# Patient Record
Sex: Female | Born: 1935 | Race: White | Hispanic: No | Marital: Married | Smoking: Never smoker
Health system: Southern US, Community
[De-identification: ages and names within clinical notes are randomized; demographics above are authoritative.]

## PROBLEM LIST (undated history)

## (undated) DIAGNOSIS — I679 Cerebrovascular disease, unspecified: Principal | ICD-10-CM

## (undated) DIAGNOSIS — C801 Malignant (primary) neoplasm, unspecified: Secondary | ICD-10-CM

## (undated) DIAGNOSIS — M810 Age-related osteoporosis without current pathological fracture: Secondary | ICD-10-CM

## (undated) DIAGNOSIS — H269 Unspecified cataract: Secondary | ICD-10-CM

## (undated) DIAGNOSIS — R269 Unspecified abnormalities of gait and mobility: Secondary | ICD-10-CM

## (undated) HISTORY — PX: PARTIAL HYSTERECTOMY: SHX80

## (undated) HISTORY — PX: APPENDECTOMY: SHX54

## (undated) HISTORY — DX: Cerebrovascular disease, unspecified: I67.9

## (undated) HISTORY — DX: Age-related osteoporosis without current pathological fracture: M81.0

## (undated) HISTORY — PX: BREAST SURGERY: SHX581

## (undated) HISTORY — PX: EYE SURGERY: SHX253

## (undated) HISTORY — DX: Malignant (primary) neoplasm, unspecified: C80.1

## (undated) HISTORY — DX: Unspecified cataract: H26.9

## (undated) HISTORY — DX: Unspecified abnormalities of gait and mobility: R26.9

---

## 1999-05-14 ENCOUNTER — Encounter: Admission: RE | Admit: 1999-05-14 | Discharge: 1999-05-14 | Payer: Self-pay | Admitting: Urology

## 1999-05-14 ENCOUNTER — Encounter: Payer: Self-pay | Admitting: Urology

## 1999-06-05 ENCOUNTER — Encounter: Admission: RE | Admit: 1999-06-05 | Discharge: 1999-06-05 | Payer: Self-pay | Admitting: *Deleted

## 1999-07-15 ENCOUNTER — Other Ambulatory Visit: Admission: RE | Admit: 1999-07-15 | Discharge: 1999-07-15 | Payer: Self-pay | Admitting: *Deleted

## 2000-05-11 ENCOUNTER — Encounter: Admission: RE | Admit: 2000-05-11 | Discharge: 2000-05-11 | Payer: Self-pay | Admitting: Urology

## 2000-05-11 ENCOUNTER — Encounter: Payer: Self-pay | Admitting: Urology

## 2001-06-28 ENCOUNTER — Encounter: Admission: RE | Admit: 2001-06-28 | Discharge: 2001-06-28 | Payer: Self-pay | Admitting: Urology

## 2001-06-28 ENCOUNTER — Encounter: Payer: Self-pay | Admitting: Urology

## 2002-07-11 ENCOUNTER — Encounter: Admission: RE | Admit: 2002-07-11 | Discharge: 2002-07-11 | Payer: Self-pay | Admitting: Urology

## 2002-07-11 ENCOUNTER — Encounter: Payer: Self-pay | Admitting: Urology

## 2002-08-04 ENCOUNTER — Encounter: Admission: RE | Admit: 2002-08-04 | Discharge: 2002-08-04 | Payer: Self-pay | Admitting: *Deleted

## 2002-08-04 ENCOUNTER — Other Ambulatory Visit: Admission: RE | Admit: 2002-08-04 | Discharge: 2002-08-04 | Payer: Self-pay | Admitting: *Deleted

## 2003-07-11 ENCOUNTER — Ambulatory Visit (HOSPITAL_COMMUNITY): Admission: RE | Admit: 2003-07-11 | Discharge: 2003-07-11 | Payer: Self-pay | Admitting: Urology

## 2003-08-16 ENCOUNTER — Encounter: Admission: RE | Admit: 2003-08-16 | Discharge: 2003-08-16 | Payer: Self-pay | Admitting: *Deleted

## 2004-07-16 ENCOUNTER — Ambulatory Visit (HOSPITAL_COMMUNITY): Admission: RE | Admit: 2004-07-16 | Discharge: 2004-07-16 | Payer: Self-pay | Admitting: Urology

## 2005-07-17 ENCOUNTER — Ambulatory Visit (HOSPITAL_COMMUNITY): Admission: RE | Admit: 2005-07-17 | Discharge: 2005-07-17 | Payer: Self-pay | Admitting: Urology

## 2006-07-21 ENCOUNTER — Ambulatory Visit (HOSPITAL_COMMUNITY): Admission: RE | Admit: 2006-07-21 | Discharge: 2006-07-21 | Payer: Self-pay | Admitting: Urology

## 2007-07-27 ENCOUNTER — Ambulatory Visit (HOSPITAL_COMMUNITY): Admission: RE | Admit: 2007-07-27 | Discharge: 2007-07-27 | Payer: Self-pay | Admitting: Urology

## 2014-02-28 ENCOUNTER — Ambulatory Visit (INDEPENDENT_AMBULATORY_CARE_PROVIDER_SITE_OTHER): Payer: Medicare Other

## 2014-02-28 ENCOUNTER — Ambulatory Visit (INDEPENDENT_AMBULATORY_CARE_PROVIDER_SITE_OTHER): Payer: Medicare Other | Admitting: Family Medicine

## 2014-02-28 VITALS — BP 134/92 | HR 102 | Temp 98.0°F | Resp 16 | Ht 59.25 in | Wt 110.4 lb

## 2014-02-28 DIAGNOSIS — M79604 Pain in right leg: Secondary | ICD-10-CM

## 2014-02-28 DIAGNOSIS — I872 Venous insufficiency (chronic) (peripheral): Secondary | ICD-10-CM

## 2014-02-28 DIAGNOSIS — R600 Localized edema: Secondary | ICD-10-CM

## 2014-02-28 DIAGNOSIS — M25571 Pain in right ankle and joints of right foot: Secondary | ICD-10-CM

## 2014-02-28 NOTE — Patient Instructions (Addendum)
When at rest keep the leg elevated, avoiding prolonged standing  It is okay to walk, it is the non-motion standing that is of more concern.  Continue wearing the compression stocking  Referral will be made to the vein clinic on a [address] Dr. Cresenciano Lick. When that appointment is made check with their office to make sure that your insurance he applies.  Return if worse at any time.

## 2014-02-28 NOTE — Progress Notes (Signed)
Subjective: About a month ago the patient developed swelling for unexplained reasons in the right leg. She has been an active walker, walking up to 2 miles a day. She knows of no injury or anything she does differently. She ago for about a week, then saw her primary care doctor who checked a venous Doppler ultrasound. This did not reveal any clot. The report was located and I reviewed it. The patient has had tenderness in the posterior medial aspect of the lower leg and ankle. It does not hurt when she does not palpated. The swelling continues to persist. The primary care doctor advised wearing support hose on that leg, which she has done, with limited improvement.  Objective: Minimal cystic nature in the right popliteal fossa. Leg is swollen, 2+ to 3+. She has tenderness over the posterior medial aspect of the gastrocnemius. The swelling extends into the ankle. She is tender down her shin.  [facility] reading (PRIMARY) by  Dr. Linna Darner Normal tib/fib Normal ankle  Assessment: Venous valvular insufficiency right lower extremity Small Baker's cyst on ultrasound  Plan: Elevate at rest Walking is okay Wear support hose Referral to the vein clinic for evaluation.

## 2014-03-02 ENCOUNTER — Telehealth: Payer: Self-pay

## 2014-03-02 NOTE — Telephone Encounter (Signed)
Patient called to ask the status of her ortho referral. I told her as soon as it is in our workque we will work on it. Waiting for Dr. Linna Darner to put on in or someone clinical. Thank you

## 2014-03-02 NOTE — Telephone Encounter (Signed)
I see referral to vein clinic. Is this correct? Or do we need Ortho eval also? Please advise.

## 2014-03-04 ENCOUNTER — Other Ambulatory Visit: Payer: Self-pay | Admitting: Physician Assistant

## 2014-03-04 DIAGNOSIS — M79604 Pain in right leg: Secondary | ICD-10-CM

## 2014-03-04 DIAGNOSIS — M25571 Pain in right ankle and joints of right foot: Secondary | ICD-10-CM

## 2014-03-04 NOTE — Telephone Encounter (Signed)
I did ortho eval due to ankle pain.

## 2017-10-27 ENCOUNTER — Other Ambulatory Visit: Payer: Self-pay

## 2017-10-27 ENCOUNTER — Telehealth: Payer: Self-pay | Admitting: Neurology

## 2017-10-27 ENCOUNTER — Encounter: Payer: Self-pay | Admitting: Neurology

## 2017-10-27 ENCOUNTER — Ambulatory Visit: Payer: Medicare Other | Admitting: Neurology

## 2017-10-27 DIAGNOSIS — R413 Other amnesia: Secondary | ICD-10-CM

## 2017-10-27 MED ORDER — DONEPEZIL HCL 5 MG PO TABS: 5.0000 mg | ORAL_TABLET | Freq: Every day | ORAL | 1 refills | Status: DC

## 2017-10-27 NOTE — Telephone Encounter (Signed)
[employer] medicare order sent to GI. No auth they will reach out to the pt to schedule.  °

## 2017-10-27 NOTE — Patient Instructions (Signed)
We will start Aricept for memory.  Begin Aricept (donepezil) at 5 mg at night for one month. If this medication is well-tolerated, please call our office and we will call in a prescription for the 10 mg tablets. Look out for side effects that may include nausea, diarrhea, weight loss, or stomach cramps. This medication will also cause a runny nose, therefore there is no need for allergy medications for this purpose.  

## 2017-10-27 NOTE — Progress Notes (Signed)
Reason for visit: Memory disturbance  Referring physician: Dr. Shawnee Knapp is a 84 y.o. female  History of present illness:  Jessica Benton is an 84 year old white female with a history of a progressive memory disturbance.  The patient comes in with her husband and son and daughter.  The patient apparently has had some progression of memory over 2 or 3 years.  One year ago she was still operating a motor vehicle but she was getting lost, she could not remember how to get back from the dentist office to her own home.  At that point, she stopped driving.  The patient has required assistance with keeping up with medications and appointments 1 year ago and 8 months ago began requiring some assistance with doing the finances.  The patient does not cook, her husband does this.  The patient is maintaining her weight fairly well over the last year or 2.  The patient has not had any falls, her balance has changed over the last several years.  She is not sleeping well, she may become agitated at times.  She denies issues controlling the bowels or the bladder.  She denies any numbness or weakness of the extremities.  The patient lives in a home with her husband, they do have a flight of stairs that they need to go up and down every day.  The family is helping out with getting the groceries and with transportation.  The patient has had blood work done to include a thyroid profile and B12 level that were unremarkable.  She is sent to this office for further evaluation.  Past Medical History:  Diagnosis Date  . Cancer ([place])   . Cataract   . Osteoporosis     Past Surgical History:  Procedure Laterality Date  . APPENDECTOMY    . BREAST SURGERY    . EYE SURGERY    . PARTIAL HYSTERECTOMY      Family History  Problem Relation Age of Onset  . Parkinsonism Mother   . Dementia Mother   . Heart attack Father     Social history:  reports that she has never smoked. She has never used smokeless  tobacco. She reports that she does not drink alcohol or use drugs.  Medications:  Prior to Admission medications   Not on File      Allergies  Allergen Reactions  . Morphine And Related   . Sulfa Antibiotics     ROS:  Out of a complete 14 system review of symptoms, the patient complains only of the following symptoms, and all other reviewed systems are negative.  Constipation Joint pain, joint swelling Memory loss, confusion Insomnia, sleepiness   Blood pressure 135/82, pulse 77, weight 96 lb (43.5 kg).  Physical Exam  General: The patient is alert and cooperative at the time of the examination.  Eyes: Pupils are equal, round, and reactive to light. Discs are flat bilaterally.  Neck: The neck is supple, no carotid bruits are noted.  Respiratory: The respiratory examination is clear.  Cardiovascular: The cardiovascular examination reveals a regular rate and rhythm, no obvious murmurs or rubs are noted.  Skin: Extremities are without significant edema.  Neurologic Exam  Mental status: The patient is alert and oriented x 2 at the time of the examination (not oriented to date). The Mini-Mental status examination done today shows a total score of 20/30.  Cranial nerves: Facial symmetry is present. There is good sensation of the face to pinprick and  soft touch bilaterally. The strength of the facial muscles and the muscles to head turning and shoulder shrug are normal bilaterally. Speech is well enunciated, no aphasia or dysarthria is noted. Extraocular movements are full. Visual fields are full. The tongue is midline, and the patient has symmetric elevation of the soft palate. No obvious hearing deficits are noted.  Motor: The motor testing reveals 5 over 5 strength of all 4 extremities. Good symmetric motor tone is noted throughout.  Sensory: Sensory testing is intact to pinprick, soft touch and vibration sensation on all 4 extremities.  The patient has poor position sense  on all 4 extremities, it is not clear whether she is perseverating or she is unable to sense position of her fingers and toes.  No evidence of extinction is noted.  Coordination: Cerebellar testing reveals good finger-nose-finger and heel-to-shin bilaterally.  Gait and station: Gait is slightly wide-based, the patient walk independently.  Romberg is negative.  Reflexes: Deep tendon reflexes are symmetric and normal bilaterally. Toes are downgoing bilaterally.   Assessment/Plan:  1.  Progressive memory disturbance  The patient will be placed on low-dose Aricept, they will call for any dose adjustments.  He will be set up for a CT scan of the brain.  She will follow-up in 6 months.  Jill Alexanders MD 10/27/2017 2:48 PM  [facility] [address] [place], [place] [address]  Phone [phone] Fax [phone]

## 2017-11-11 ENCOUNTER — Ambulatory Visit
Admission: RE | Admit: 2017-11-11 | Discharge: 2017-11-11 | Disposition: A | Discharge status: Home / Self Care | Payer: Medicare Other | Source: Ambulatory Visit | Attending: Neurology | Admitting: Neurology

## 2017-11-11 DIAGNOSIS — R413 Other amnesia: Secondary | ICD-10-CM | POA: Diagnosis not present

## 2017-11-12 ENCOUNTER — Telehealth: Payer: Self-pay | Admitting: Neurology

## 2017-11-12 NOTE — Telephone Encounter (Signed)
I called the patient, talk with husband.  CT of the head shows extensive white matter disease, I would recommend going on 81 mg aspirin daily.  The patient will continue on the donepezil.    CT head 11/12/17:  IMPRESSION: This CT scan of the head without contrast shows the following: 1.    Moderately severe generalized cortical atrophy.  The atrophy is not more pronounced in the mesial temporal lobes than elsewhere.  There is also cerebellar and corpus callosal atrophy noted. 2.    Severe white matter changes in both hemispheres consistent with severe chronic microvascular ischemic change.   3.   There are no acute findings.

## 2017-11-16 NOTE — Telephone Encounter (Signed)
Pt's daughter Holli/DPR [phone] asked for call back from Dr Jannifer Franklin. She is wanting clarification on MRI result, she is not sure her father relayed the results correctly.

## 2017-11-16 NOTE — Telephone Encounter (Signed)
I called the daughter.  CT scan of the brain did show extensive white matter changes, for this reason, low-dose aspirin was recommended.  She will call back if any further questions are noted.

## 2017-11-30 ENCOUNTER — Other Ambulatory Visit: Payer: Self-pay | Admitting: Neurology

## 2017-11-30 NOTE — Telephone Encounter (Signed)
I increased donepezil to 10mg  qhs.   I agree with [facility] eval. Patient should use walker.   Penni Bombard, MD 08/31/3071, 5:43 PM Certified in Neurology, Neurophysiology and Neuroimaging  [facility] [address] [place],  [address] [phone]

## 2017-11-30 NOTE — Telephone Encounter (Signed)
Called and spoke with daughter, [facility]. Relayed Dr. Gladstone Lighter message.  She is concerned about her mothers memory changes. I offered appt tomorrow at 1130am with Jinny Blossom, NP but she declined. She will continue to monitor her and call back if they would like her seen. Advised they can also f/u with PCP if needed. We can also schedule f/u for her to see Dr. Jannifer Franklin. She verbalized understanding and appreciation.

## 2017-11-30 NOTE — Telephone Encounter (Signed)
Called daughter, [facility]. She states her mother is doing well on Aricept 5mg  tablet, no SE. She is agreeable to increase to 10mg  tablet at bedtime if ok per [facility]. She is wondering if she is able to take at dinnertime instead as well.   She also wanted to inform Dr. Jannifer Franklin that she is now holding onto others while trying to ambulate. She is more unsteady on feet, having balance problems. Does not ambulate with any assistive devices. Has walker to use if needed at their house. She does not like to use this. Only recent medication started was the ASA 81mg  qd by Dr. Jannifer Franklin.  They are having initial evaluation for in home care by [facility] this coming Friday. They are going to evaluate both her and husband. Husband requested more help in the home. They are going to help with ADL's., help around the house, and medication administration. She has had no falls since last seen. She gets easily fatigued. She has hx Osteoporosis.  This past Saturday they cleaned out her closet and she got very tired. Sleeps a lot during the day. Roaming at night sometimes per husband.   Daughter wanting to know if anything else should be done given the above information. Advised Dr. Jannifer Franklin out of the office. I will send to [facility] to advise. We will call her back.

## 2017-12-29 ENCOUNTER — Other Ambulatory Visit: Payer: Self-pay | Admitting: Neurology

## 2018-05-11 ENCOUNTER — Encounter: Payer: Self-pay | Admitting: Adult Health

## 2018-05-11 ENCOUNTER — Ambulatory Visit: Payer: Medicare Other | Admitting: Adult Health

## 2018-06-13 ENCOUNTER — Ambulatory Visit: Payer: Medicare Other | Admitting: Neurology

## 2018-06-13 ENCOUNTER — Encounter: Payer: Self-pay | Admitting: Neurology

## 2018-06-13 DIAGNOSIS — F0150 Vascular dementia without behavioral disturbance: Secondary | ICD-10-CM | POA: Insufficient documentation

## 2018-06-13 DIAGNOSIS — I679 Cerebrovascular disease, unspecified: Secondary | ICD-10-CM

## 2018-06-13 DIAGNOSIS — R269 Unspecified abnormalities of gait and mobility: Secondary | ICD-10-CM | POA: Diagnosis not present

## 2018-06-13 DIAGNOSIS — G309 Alzheimer's disease, unspecified: Secondary | ICD-10-CM

## 2018-06-13 DIAGNOSIS — F0280 Dementia in other diseases classified elsewhere without behavioral disturbance: Secondary | ICD-10-CM

## 2018-06-13 HISTORY — DX: Cerebrovascular disease, unspecified: I67.9

## 2018-06-13 HISTORY — DX: Unspecified abnormalities of gait and mobility: R26.9

## 2018-06-13 MED ORDER — DONEPEZIL HCL 10 MG PO TABS: 10.0000 mg | ORAL_TABLET | Freq: Every day | ORAL | 3 refills | Status: DC

## 2018-06-13 NOTE — Progress Notes (Signed)
Reason for visit: Memory disturbance  Jessica Benton is an 85 y.o. female  History of present illness:  Jessica Benton is an 85 year old right-handed white female with a history of a progressive memory disturbance.  The patient has been placed on Aricept taking 10 mg at night, she has tolerated the medication fairly well, she has lost only 2 pounds since last seen.  The daughter who accompanies her today indicates that there has been some change in memory since the summer with the short-term memory.  The patient occasionally will have periods of agitation.  The patient does have a mild gait disturbance, she does not use a cane, she has not had any falls since last seen.  She has to walk up and down a flight of stairs to her bedroom each day.  The patient did have a CT scan of the brain that showed extensive white matter changes, she was asked to go on low-dose aspirin but she has not consistently taken this.  She returns for an evaluation.  Past Medical History:  Diagnosis Date  . Cancer ([place])   . Cataract   . Osteoporosis     Past Surgical History:  Procedure Laterality Date  . APPENDECTOMY    . BREAST SURGERY    . EYE SURGERY    . PARTIAL HYSTERECTOMY      Family History  Problem Relation Age of Onset  . Parkinsonism Mother   . Dementia Mother   . Heart attack Father     Social history:  reports that she has never smoked. She has never used smokeless tobacco. She reports that she does not drink alcohol or use drugs.    Allergies  Allergen Reactions  . Morphine And Related   . Sulfa Antibiotics     Medications:  Prior to Admission medications   Medication Sig Start Date End Date Taking? Authorizing Provider  donepezil (ARICEPT) 10 MG tablet Take 1 tablet (10 mg total) by mouth at bedtime. 11/30/17   Penumalli, Earlean Polka, MD    ROS:  Out of a complete 14 system review of symptoms, the patient complains only of the following symptoms, and all other reviewed systems are  negative.  Runny nose Memory loss  Blood pressure (!) 146/81, pulse 67, height 4' 10.5" (1.486 m), weight 94 lb (42.6 kg).  Physical Exam  General: The patient is alert and cooperative at the time of the examination.  Skin: No significant peripheral edema is noted.   Neurologic Exam  Mental status: The patient is alert and oriented x 2 at the time of the examination (not oriented to date). The Mini-Mental status examination done today shows a total score 18/30.   Cranial nerves: Facial symmetry is present. Speech is normal, no aphasia or dysarthria is noted. Extraocular movements are full. Visual fields are full.  Motor: The patient has good strength in all 4 extremities.  Sensory examination: Soft touch sensation is symmetric on the face, arms, and legs.  Coordination: The patient has good finger-nose-finger and heel-to-shin bilaterally.  Gait and station: The patient has a slightly wide-based gait, the patient can walk independently.  Tandem gait is unsteady. Romberg is negative. No drift is seen.  Reflexes: Deep tendon reflexes are symmetric.   CT head 11/12/17:  IMPRESSION: This CT scan of the head without contrast shows the following: 1. Moderately severe generalized cortical atrophy. The atrophy is not more pronounced in the mesial temporal lobes than elsewhere. There is also cerebellar and corpus callosal  atrophy noted. 2. Severe white matter changes in both hemispheres consistent with severe chronic microvascular ischemic change.  3. There are no acute findings.   * CT scan images were reviewed online. I agree with the written report.    Assessment/Plan:  1.  Mixed Alzheimer disease and vascular dementia  The patient will remain on Aricept.  I have asked her to start low-dose aspirin, 81 mg daily.  She will follow-up in 6 months.  A prescription was sent in for the Aricept.  Jill Alexanders MD 06/13/2018 12:27 PM  [facility] [address] [place], [place] [address]  Phone [phone] Fax [phone]

## 2018-10-24 ENCOUNTER — Telehealth: Payer: Self-pay | Admitting: Neurology

## 2018-10-24 ENCOUNTER — Encounter: Payer: Self-pay | Admitting: Neurology

## 2018-10-24 NOTE — Telephone Encounter (Signed)
I called the daughter, the patient has a history of dementia, I will asked that her name be removed permanently from the list of potential juris.

## 2018-10-24 NOTE — Telephone Encounter (Signed)
Pt received a summons for [employer] and wants to know if a letter can been written to excuse her from doing so .

## 2018-10-25 NOTE — Telephone Encounter (Signed)
I contacted the pt's daughter, Earnest  ( ok per dpr) and advised letter is ready. Earnest  states she will come by the clinic this afternoon and pick the letter up.   Letter placed up front at [place].

## 2018-12-12 ENCOUNTER — Ambulatory Visit: Payer: Medicare Other | Admitting: Neurology

## 2018-12-13 NOTE — Progress Notes (Signed)
PATIENT: Jessica Benton DOB: 08/20/1928  REASON FOR VISIT: follow up HISTORY FROM: patient  HISTORY OF PRESENT ILLNESS: Today 12/14/18  Jessica Benton is an 85 year old female with history of progressive memory disturbance.  Her last memory score was 18/30.  She remains on Aricept 10 mg in the evening, she is tolerating medication well.  Her daughter accompanies her at today's visit, she reports she is seeing a decline in her memory since last visit.  She has noticed repetitive questioning, having an increasing time remembering things.  She is able to perform her ADLs with minimal assistance.  She does not do any cooking, but this is not new.  Her daughter prepares meals and takes leftovers to her parents.  She indicates her appetite is adequate, since last visit she has lost 3 pounds.  Her husband manages her medications, but sometimes she refuses to take them.  She indicates she sleeps well at night, but she goes to bed late, and then will sleep in.  Her daughter reports at times she gets frustrated easily, and has some agitation.  She has not had any recent fall.  She remains on aspirin 81 mg daily, after CT scan of the brain showed extensive white matter changes.  She presents today for follow-up.  HISTORY 06/13/2018 Dr. Jannifer Franklin: Ms. Kareem is an 85 year old right-handed white female with a history of a progressive memory disturbance.  The patient has been placed on Aricept taking 10 mg at night, she has tolerated the medication fairly well, she has lost only 2 pounds since last seen.  The daughter who accompanies her today indicates that there has been some change in memory since the summer with the short-term memory.  The patient occasionally will have periods of agitation.  The patient does have a mild gait disturbance, she does not use a cane, she has not had any falls since last seen.  She has to walk up and down a flight of stairs to her bedroom each day.  The patient did have a CT scan of  the brain that showed extensive white matter changes, she was asked to go on low-dose aspirin but she has not consistently taken this.  She returns for an evaluation.  REVIEW OF SYSTEMS: Out of a complete 14 system review of symptoms, the patient complains only of the following symptoms, and all other reviewed systems are negative.  Memory loss  ALLERGIES: Allergies  Allergen Reactions  . Morphine And Related   . Sulfa Antibiotics     HOME MEDICATIONS: Outpatient Medications Prior to Visit  Medication Sig Dispense Refill  . aspirin EC 81 MG tablet Take 81 mg by mouth daily. 1 in the morning and 1 at night.    . donepezil (ARICEPT) 10 MG tablet Take 1 tablet (10 mg total) by mouth at bedtime. 90 tablet 3   No facility-administered medications prior to visit.     PAST MEDICAL HISTORY: Past Medical History:  Diagnosis Date  . Cancer ([place])   . Cataract   . Cerebrovascular disease 06/13/2018  . Gait disorder 06/13/2018  . Osteoporosis     PAST SURGICAL HISTORY: Past Surgical History:  Procedure Laterality Date  . APPENDECTOMY    . BREAST SURGERY    . EYE SURGERY    . PARTIAL HYSTERECTOMY      FAMILY HISTORY: Family History  Problem Relation Age of Onset  . Parkinsonism Mother   . Dementia Mother   . Heart attack Father  SOCIAL HISTORY: Social History   Socioeconomic History  . Marital status: Married    Spouse name: Not on file  . Number of children: 2  . Years of education: 12+  . Highest education level: Not on file  Occupational History  . Occupation: Retired [employer]  Social Needs  . Financial resource strain: Not on file  . Food insecurity    Worry: Not on file    Inability: Not on file  . Transportation needs    Medical: Not on file    Non-medical: Not on file  Tobacco Use  . Smoking status: Never Smoker  . Smokeless tobacco: Never Used  Substance and Sexual Activity  . Alcohol use: No  . Drug use: No  . Sexual activity: Not on file   Lifestyle  . Physical activity    Days per week: Not on file    Minutes per session: Not on file  . Stress: Not on file  Relationships  . Social Herbalist on phone: Not on file    Gets together: Not on file    Attends religious service: Not on file    Active member of club or organization: Not on file    Attends meetings of clubs or organizations: Not on file    Relationship status: Not on file  . Intimate partner violence    Fear of current or ex partner: Not on file    Emotionally abused: Not on file    Physically abused: Not on file    Forced sexual activity: Not on file  Other Topics Concern  . Not on file  Social History Narrative   Lives with husband   Caffeine use: only decaf   Right handed     PHYSICAL EXAM  Vitals:   12/14/18 1109  BP: (!) 141/78  Pulse: 67  Temp: 98.2 F (36.8 C)  Weight: 91 lb 6.4 oz (41.5 kg)  Height: 4' 10.5" (1.486 m)   Body mass index is 18.78 kg/m.  Generalized: Well developed, in no acute distress  MMSE - Mini Mental State Exam 12/14/2018 06/13/2018 10/27/2017  Orientation to time 1 0 1  Orientation to Place 3 3 4   Registration 3 3 3   Attention/ Calculation 0 4 4  Recall 0 0 0  Language- name 2 objects 2 2 2   Language- repeat 1 1 1   Language- follow 3 step command 3 3 3   Language- read & follow direction 1 1 1   Write a sentence 1 1 1   Copy design 1 0 0  Copy design-comments named 7 animals - -  Total score 16 18 20     Neurological examination  Mentation: Alert oriented to place, most of history is provided by her daughter. Follows all commands speech and language fluent Cranial nerve II-XII: Pupils were equal round reactive to light. Extraocular movements were full, visual field were full on confrontational test. Facial sensation and strength were normal.  Head turning and shoulder shrug  were normal and symmetric. Motor: The motor testing reveals 5 over 5 strength of all 4 extremities. Good symmetric motor tone is  noted throughout.  Sensory: Sensory testing is intact to soft touch on all 4 extremities. No evidence of extinction is noted.  Coordination: Cerebellar testing reveals good finger-nose-finger and heel-to-shin bilaterally.  Gait and station: Gait is normal, but slow  Reflexes: Deep tendon reflexes are symmetric and normal bilaterally.   DIAGNOSTIC DATA (LABS, IMAGING, TESTING) - I reviewed patient records, labs,  notes, testing and imaging myself where available.  No results found for: WBC, HGB, HCT, MCV, PLT No results found for: NA, K, CL, CO2, GLUCOSE, BUN, CREATININE, CALCIUM, PROT, ALBUMIN, AST, ALT, ALKPHOS, BILITOT, GFRNONAA, GFRAA No results found for: CHOL, HDL, LDLCALC, LDLDIRECT, TRIG, CHOLHDL No results found for: HGBA1C No results found for: VITAMINB12 No results found for: TSH  ASSESSMENT AND PLAN 85 y.o. year old female  has a past medical history of Cancer ([place]), Cataract, Cerebrovascular disease (06/13/2018), Gait disorder (06/13/2018), and Osteoporosis. here with:  1.  Memory disturbance  She has had a slight decline in her memory score at 16/30.  She will continue taking Aricept 10 mg at bedtime, however we will closely monitor her weight.  Since last visit she has lost 3 pounds.  In the future, we may have to decrease the dose, as she only weighs 91 pounds currently.  We will start Namenda today, staying at 5 mg daily.  At times, her husband has difficulty getting her to take her medications.  She has displayed some frustration with her daughter and husband at times.  In the future, we may need to consider the addition of an SSRI for mood stabilization.  I have encouraged her to routinely follow with her primary care provider.  She will continue taking daily 81 mg aspirin for CT scan of the brain that showed extensive white matter changes.  In the future, her daughter is considering moving the patient into her home for ongoing care.  She will follow-up in 6 months or sooner if  needed.  I advised that if her symptoms worsen or she develops any new symptoms she should let us know.  I spent 15 minutes with the patient. 50% of this time was spent discussing her plan of care.   Butler Denmark, AGNP-C, DNP 12/14/2018, 12:03 PM [facility] [address], [place], [place] [address] [phone]

## 2018-12-14 ENCOUNTER — Encounter: Payer: Self-pay | Admitting: Neurology

## 2018-12-14 ENCOUNTER — Other Ambulatory Visit: Payer: Self-pay

## 2018-12-14 ENCOUNTER — Ambulatory Visit (INDEPENDENT_AMBULATORY_CARE_PROVIDER_SITE_OTHER): Payer: Medicare Other | Admitting: Neurology

## 2018-12-14 VITALS — BP 141/78 | HR 67 | Temp 98.2°F | Ht 58.5 in | Wt 91.4 lb

## 2018-12-14 DIAGNOSIS — F0150 Vascular dementia without behavioral disturbance: Secondary | ICD-10-CM | POA: Diagnosis not present

## 2018-12-14 DIAGNOSIS — F0280 Dementia in other diseases classified elsewhere without behavioral disturbance: Secondary | ICD-10-CM

## 2018-12-14 DIAGNOSIS — G309 Alzheimer's disease, unspecified: Secondary | ICD-10-CM

## 2018-12-14 MED ORDER — MEMANTINE HCL 5 MG PO TABS: 5.0000 mg | ORAL_TABLET | Freq: Every day | ORAL | 3 refills | Status: DC

## 2018-12-14 NOTE — Progress Notes (Signed)
I have read the note, and I agree with the clinical assessment and plan.  Jessica Benton [name]   

## 2018-12-14 NOTE — Progress Notes (Signed)
I have read the note, and I agree with the clinical assessment and plan.  Glendola Friedhoff K Shelden Raborn   

## 2018-12-14 NOTE — Patient Instructions (Signed)
Memantine Tablets What is this medicine? MEMANTINE (MEM an teen) is used to treat dementia caused by Alzheimer's disease. This medicine may be used for other purposes; ask your health care provider or pharmacist if you have questions. COMMON BRAND NAME(S): Namenda What should I tell my health care provider before I take this medicine? They need to know if you have any of these conditions:  difficulty passing urine  kidney disease  liver disease  seizures  an unusual or allergic reaction to memantine, other medicines, foods, dyes, or preservatives  pregnant or trying to get pregnant  breast-feeding How should I use this medicine? Take this medicine by mouth with a glass of water. Follow the directions on the prescription label. You may take this medicine with or without food. Take your doses at regular intervals. Do not take your medicine more often than directed. Continue to take your medicine even if you feel better. Do not stop taking except on the advice of your doctor or health care professional. Talk to your pediatrician regarding the use of this medicine in children. Special care may be needed. Overdosage: If you think you have taken too much of this medicine contact a poison control center or emergency room at once. NOTE: This medicine is only for you. Do not share this medicine with others. What if I miss a dose? If you miss a dose, take it as soon as you can. If it is almost time for your next dose, take only that dose. Do not take double or extra doses. If you do not take your medicine for several days, contact your health care provider. Your dose may need to be changed. What may interact with this medicine?  acetazolamide  amantadine  cimetidine  dextromethorphan  dofetilide  hydrochlorothiazide  ketamine  metformin  methazolamide  quinidine  ranitidine  sodium bicarbonate  triamterene This list may not describe all possible interactions. Give your  health care provider a list of all the medicines, herbs, non-prescription drugs, or dietary supplements you use. Also tell them if you smoke, drink alcohol, or use illegal drugs. Some items may interact with your medicine. What should I watch for while using this medicine? Visit your doctor or health care professional for regular checks on your progress. Check with your doctor or health care professional if there is no improvement in your symptoms or if they get worse. You may get drowsy or dizzy. Do not drive, use machinery, or do anything that needs mental alertness until you know how this drug affects you. Do not stand or sit up quickly, especially if you are an older patient. This reduces the risk of dizzy or fainting spells. Alcohol can make you more drowsy and dizzy. Avoid alcoholic drinks. What side effects may I notice from receiving this medicine? Side effects that you should report to your doctor or health care professional as soon as possible:  allergic reactions like skin rash, itching or hives, swelling of the face, lips, or tongue  agitation or a feeling of restlessness  depressed mood  dizziness  hallucinations  redness, blistering, peeling or loosening of the skin, including inside the mouth  seizures  vomiting Side effects that usually do not require medical attention (report to your doctor or health care professional if they continue or are bothersome):  constipation  diarrhea  headache  nausea  trouble sleeping This list may not describe all possible side effects. Call your doctor for medical advice about side effects. You may report side effects  to FDA at 1-800-FDA-1088. Where should I keep my medicine? Keep out of the reach of children. Store at room temperature between 15 degrees and 30 degrees C (59 degrees and 86 degrees F). Throw away any unused medicine after the expiration date. NOTE: This sheet is a summary. It may not cover all possible information.  If you have questions about this medicine, talk to your doctor, pharmacist, or health care provider.  2020 Elsevier/Gold Standard (2013-02-27 14:10:42)

## 2019-01-24 ENCOUNTER — Telehealth: Payer: Self-pay | Admitting: *Deleted

## 2019-01-24 MED ORDER — MEMANTINE HCL 5 MG PO TABS: ORAL_TABLET | ORAL | 0 refills | Status: DC

## 2019-01-24 MED ORDER — MEMANTINE HCL 5 MG PO TABS
ORAL_TABLET | ORAL | 0 refills | Status: DC
Start: 2019-01-24 — End: 2019-01-24

## 2019-01-24 NOTE — Telephone Encounter (Signed)
I called the daughter.  Patient apparently went off of Aricept, she was supposed to stay on the medication.  They started a very low-dose of Namenda, only 5 mg a day.  I will give them a prescription to ramp up the dose to eventually 10 mg twice daily.  If the agitation continues, particularly evenings, we may add low-dose mirtazapine in the evenings.

## 2019-01-24 NOTE — Telephone Encounter (Signed)
Patients daughter, Trinidad Curet, states the [place] is not working.  She seems to have anger issues.  Not getting along with her husband.  Woke up at 2:30AM and made breakfast.  Thought it was morning time and kept her husband up all night.  Is there another medication they could try?  If so they use [facility].  Please call.

## 2019-01-25 DIAGNOSIS — Z0289 Encounter for other administrative examinations: Secondary | ICD-10-CM

## 2019-01-25 NOTE — Telephone Encounter (Signed)
Rx has been faxed to [facility]. Fax # [phone]. Confirmation received.

## 2019-01-31 ENCOUNTER — Telehealth: Payer: Self-pay | Admitting: Neurology

## 2019-01-31 NOTE — Telephone Encounter (Signed)
Pt daughter has returned call to NP Schaad, she is asking for a call back

## 2019-01-31 NOTE — Telephone Encounter (Signed)
I called the patient's daughter to seek clarification about the FMLA paperwork she is asking [place] to fill out for her. I left a message.

## 2019-02-01 NOTE — Telephone Encounter (Signed)
I spoke with the daughter, Earnest Bailey.  She is requesting to have time off from work to help care for her mother who has dementia.  She needs time off from work to care for her mother in the afternoons.  I will sign the paperwork at her request.  She is requesting this until December 2020.  I just asked that she ensures her work will allow that much time off.  At this time, she is not willing to hire an aide to assist her due to [place].

## 2019-02-02 ENCOUNTER — Telehealth: Payer: Self-pay | Admitting: *Deleted

## 2019-02-02 NOTE — Telephone Encounter (Signed)
To MR. 

## 2019-02-02 NOTE — Telephone Encounter (Signed)
Handicap placard signed, given to MR.  FMLA form for daughter, complete, to SS/NP for review, sign, then to MR.

## 2019-02-02 NOTE — Telephone Encounter (Signed)
Paperwork signed and given back to [place].

## 2019-02-02 NOTE — Telephone Encounter (Signed)
Pt FMLA form @ the front desk for [facility] [employer] to p/u on Monday.

## 2019-02-28 ENCOUNTER — Other Ambulatory Visit: Payer: Self-pay | Admitting: Neurology

## 2019-03-01 ENCOUNTER — Other Ambulatory Visit: Payer: Self-pay | Admitting: *Deleted

## 2019-03-01 ENCOUNTER — Telehealth: Payer: Self-pay | Admitting: Neurology

## 2019-03-01 MED ORDER — MEMANTINE HCL 10 MG PO TABS: ORAL_TABLET | ORAL | 5 refills | Status: DC

## 2019-03-01 NOTE — Telephone Encounter (Signed)
Called and LVM for Jessica Benton (on DPR) to let her know we already sent in rx for memantine 2 tablets po BID earlier this morning to [facility]. Advised her to contact pharmacy back to make sure they received this. She can call our office back if any further questions/concerns.

## 2019-03-01 NOTE — Telephone Encounter (Signed)
Pt daughter is calling from the pharmacy to inform that they are kindly giving her 2 pills for pt for tonight but as soon as possible a new script needs to be sent to pharmacy for the 4 a day.

## 2019-03-01 NOTE — Telephone Encounter (Signed)
Called and spoke w/ daughter. Insurance will not cover 5mg  tabs (2 tabs po BID). I sent in new rx for 10mg  tablet po BID. Advised this is same dose just less tablets for pt to take. Asked her to call back if they have any further issues picking up rx from pharmacy. She verbalized understanding.

## 2019-03-01 NOTE — Telephone Encounter (Signed)
Holly called for the pt stating that the pharmacy has the pt's memantine (NAMENDA) 5 MG tablet instructions incorrect. The pharmacy has it as 3 times a day and its to be 2tab twice a day. They would like to know if this can be resent correctly so that they can pick it up today due to the pt not having anymore. Please advise.

## 2019-06-20 ENCOUNTER — Other Ambulatory Visit: Payer: Self-pay

## 2019-06-20 ENCOUNTER — Encounter: Payer: Self-pay | Admitting: Neurology

## 2019-06-20 ENCOUNTER — Ambulatory Visit: Payer: Medicare PPO | Admitting: Neurology

## 2019-06-20 VITALS — BP 102/60 | HR 73 | Temp 97.8°F | Ht 58.5 in | Wt 95.6 lb

## 2019-06-20 DIAGNOSIS — G309 Alzheimer's disease, unspecified: Secondary | ICD-10-CM

## 2019-06-20 DIAGNOSIS — F0280 Dementia in other diseases classified elsewhere without behavioral disturbance: Secondary | ICD-10-CM

## 2019-06-20 DIAGNOSIS — F0150 Vascular dementia without behavioral disturbance: Secondary | ICD-10-CM

## 2019-06-20 MED ORDER — MEMANTINE HCL 10 MG PO TABS: ORAL_TABLET | ORAL | 3 refills | Status: DC

## 2019-06-20 MED ORDER — DONEPEZIL HCL 10 MG PO TABS: 10.0000 mg | ORAL_TABLET | Freq: Every day | ORAL | 3 refills | Status: DC

## 2019-06-20 NOTE — Patient Instructions (Addendum)
We will continue current medications, refills were sent. Memory score was stable. Return in 6 months

## 2019-06-20 NOTE — Progress Notes (Signed)
PATIENT: Jessica Benton DOB: 1928/12/14  REASON FOR VISIT: follow up HISTORY FROM: patient  HISTORY OF PRESENT ILLNESS: Today 06/20/19  Jessica Benton is an 86 year old female with a progressive memory disturbance.  She lives with her husband.  She remains on Aricept and Namenda, and aspirin.  She is hard of hearing.  She continues to have decline in her memory, trouble with short-term memory, and has repetitive questioning.  She is requiring more daily assistance and supervision from her daughter.  She is able to perform her ADLs, but now requires assistance.  She has a good appetite, her daughter brings meals to her home.  She is sleeping well.  She has not had any falls.  She is a former Pharmacist, hospital, enjoys reading.  Her daughter manages her medications, usually she takes her pills well.  Previous CT scan of the brain showed extensive white matter changes.  She may get agitated, with change from her normal routine.  She presents today for evaluation accompanied by her daughter.  HISTORY 12/14/2018 SS: Jessica Benton is an 86 year old female with history of progressive memory disturbance.  Her last memory score was 18/30.  She remains on Aricept 10 mg in the evening, she is tolerating medication well.  Her daughter accompanies her at today's visit, she reports she is seeing a decline in her memory since last visit.  She has noticed repetitive questioning, having an increasing time remembering things.  She is able to perform her ADLs with minimal assistance.  She does not do any cooking, but this is not new.  Her daughter prepares meals and takes leftovers to her parents.  She indicates her appetite is adequate, since last visit she has lost 3 pounds.  Her husband manages her medications, but sometimes she refuses to take them.  She indicates she sleeps well at night, but she goes to bed late, and then will sleep in.  Her daughter reports at times she gets frustrated easily, and has some agitation.  She has  not had any recent fall.  She remains on aspirin 81 mg daily, after CT scan of the brain showed extensive white matter changes.  She presents today for follow-up.  REVIEW OF SYSTEMS: Out of a complete 14 system review of symptoms, the patient complains only of the following symptoms, and all other reviewed systems are negative.  Memory loss  ALLERGIES: Allergies  Allergen Reactions  . Morphine And Related   . Sulfa Antibiotics     HOME MEDICATIONS: Outpatient Medications Prior to Visit  Medication Sig Dispense Refill  . aspirin EC 81 MG tablet Take 81 mg by mouth daily. 1 in the morning and 1 at night.    . donepezil (ARICEPT) 10 MG tablet Take 1 tablet (10 mg total) by mouth at bedtime. 90 tablet 3  . memantine (NAMENDA) 10 MG tablet Take 1 tablet by mouth twice daily 60 tablet 5   No facility-administered medications prior to visit.    PAST MEDICAL HISTORY: Past Medical History:  Diagnosis Date  . Cancer ([place])   . Cataract   . Cerebrovascular disease 06/13/2018  . Gait disorder 06/13/2018  . Osteoporosis     PAST SURGICAL HISTORY: Past Surgical History:  Procedure Laterality Date  . APPENDECTOMY    . BREAST SURGERY    . EYE SURGERY    . PARTIAL HYSTERECTOMY      FAMILY HISTORY: Family History  Problem Relation Age of Onset  . Parkinsonism Mother   . Dementia Mother   .  Heart attack Father     SOCIAL HISTORY: Social History   Socioeconomic History  . Marital status: Married    Spouse name: Not on file  . Number of children: 2  . Years of education: 12+  . Highest education level: Not on file  Occupational History  . Occupation: Retired [employer]  Tobacco Use  . Smoking status: Never Smoker  . Smokeless tobacco: Never Used  Substance and Sexual Activity  . Alcohol use: No  . Drug use: No  . Sexual activity: Not on file  Other Topics Concern  . Not on file  Social History Narrative   Lives with husband   Caffeine use: only decaf   Right  handed    Social Determinants of Health   Financial Resource Strain:   . Difficulty of Paying Living Expenses: Not on file  Food Insecurity:   . Worried About Charity fundraiser in the Last Year: Not on file  . Ran Out of Food in the Last Year: Not on file  Transportation Needs:   . Lack of Transportation (Medical): Not on file  . Lack of Transportation (Non-Medical): Not on file  Physical Activity:   . Days of Exercise per Week: Not on file  . Minutes of Exercise per Session: Not on file  Stress:   . Feeling of Stress : Not on file  Social Connections:   . Frequency of Communication with Friends and Family: Not on file  . Frequency of Social Gatherings with Friends and Family: Not on file  . Attends Religious Services: Not on file  . Active Member of Clubs or Organizations: Not on file  . Attends Archivist Meetings: Not on file  . Marital Status: Not on file  Intimate Partner Violence:   . Fear of Current or Ex-Partner: Not on file  . Emotionally Abused: Not on file  . Physically Abused: Not on file  . Sexually Abused: Not on file   PHYSICAL EXAM  Vitals:   06/20/19 1242  BP: 102/60  Pulse: 73  Temp: 97.8 F (36.6 C)  TempSrc: Oral  Weight: 95 lb 9.6 oz (43.4 kg)  Height: 4' 10.5" (1.486 m)   Body mass index is 19.64 kg/m.  Generalized: Well developed, in no acute distress  MMSE - Mini Mental State Exam 06/20/2019 12/14/2018 06/13/2018  Not completed: (No Data) - -  Orientation to time 1 1 0  Orientation to Place 3 3 3   Registration 3 3 3   Attention/ Calculation 1 0 4  Recall 0 0 0  Language- name 2 objects 2 2 2   Language- repeat 1 1 1   Language- follow 3 step command 3 3 3   Language- read & follow direction 1 1 1   Write a sentence 1 1 1   Copy design 0 1 0  Copy design-comments - named 7 animals -  Total score 16 16 18     Neurological examination  Mentation: Alert, most of history is provided by daughter, but patient is participatory. Follows  all commands speech and language fluent Cranial nerve II-XII: Pupils were equal round reactive to light. Extraocular movements were full, visual field were full on confrontational test. Facial sensation and strength were normal.  Head turning and shoulder shrug were normal and symmetric. Motor: The motor testing reveals 5 over 5 strength of all 4 extremities. Good symmetric motor tone is noted throughout.  Sensory: Sensory testing is intact to soft touch on all 4 extremities. No evidence of extinction  is noted.  Coordination: Cerebellar testing reveals good finger-nose-finger and heel-to-shin bilaterally.  Gait and station: Gait is normal, slightly stooped. Reflexes: Deep tendon reflexes are symmetric   DIAGNOSTIC DATA (LABS, IMAGING, TESTING) - I reviewed patient records, labs, notes, testing and imaging myself where available.  No results found for: WBC, HGB, HCT, MCV, PLT No results found for: NA, K, CL, CO2, GLUCOSE, BUN, CREATININE, CALCIUM, PROT, ALBUMIN, AST, ALT, ALKPHOS, BILITOT, GFRNONAA, GFRAA No results found for: CHOL, HDL, LDLCALC, LDLDIRECT, TRIG, CHOLHDL No results found for: HGBA1C No results found for: VITAMINB12 No results found for: TSH  ASSESSMENT AND PLAN 86 y.o. year old female  has a past medical history of Cancer ([place]), Cataract, Cerebrovascular disease (06/13/2018), Gait disorder (06/13/2018), and Osteoporosis. here with:  1.  Memory disturbance  Memory score has remained stable 16/30.  Overall, she continues to have some functional decline in her memory, is requiring more assistance and supervision with her daily activities.  Her weight has remained stable, she has actually gained 4 pounds since last seen.  She will remain on Aricept and Namenda.  Her daughter is having to be more involved in caring for the patient and her father.  She will remain on aspirin 81 mg daily, CT scan of the brain has shown extensive white matter changes.  She will follow-up in 6 months or  sooner if needed.  I did advise if her symptoms worsen or she develops any new symptoms she is let us know.   I spent 15 minutes with the patient. 50% of this time was spent discussing her plan of care.   Butler Denmark, AGNP-C, DNP 06/20/2019, 1:11 PM [facility] [address] [place], [place] [address] [phone]

## 2019-06-21 NOTE — Progress Notes (Signed)
I have read the note, and I agree with the clinical assessment and plan.  Gurpreet Mikhail K Jaskiran Pata   

## 2019-12-19 ENCOUNTER — Ambulatory Visit: Payer: Medicare PPO | Admitting: Neurology

## 2019-12-19 ENCOUNTER — Encounter: Payer: Self-pay | Admitting: Neurology

## 2019-12-19 ENCOUNTER — Other Ambulatory Visit: Payer: Self-pay

## 2019-12-19 VITALS — BP 145/74 | HR 69 | Wt 100.0 lb

## 2019-12-19 DIAGNOSIS — F0150 Vascular dementia without behavioral disturbance: Secondary | ICD-10-CM | POA: Diagnosis not present

## 2019-12-19 DIAGNOSIS — G309 Alzheimer's disease, unspecified: Secondary | ICD-10-CM

## 2019-12-19 DIAGNOSIS — F0280 Dementia in other diseases classified elsewhere without behavioral disturbance: Secondary | ICD-10-CM

## 2019-12-19 MED ORDER — MEMANTINE HCL 10 MG PO TABS: ORAL_TABLET | ORAL | 3 refills | Status: DC

## 2019-12-19 MED ORDER — DONEPEZIL HCL 10 MG PO TABS: 10.0000 mg | ORAL_TABLET | Freq: Every day | ORAL | 3 refills | Status: DC

## 2019-12-19 NOTE — Progress Notes (Signed)
PATIENT: Jessica Benton DOB: 09/28/1928  REASON FOR VISIT: follow up HISTORY FROM: patient  HISTORY OF PRESENT ILLNESS: Today 12/19/19  Jessica Benton is an 86 year old female with history of progressive memory disturbance.  She is on Aricept, Namenda, and aspirin.  She lives with her husband.  She requires more supervision.  Her daughter has been out on FMLA, is going to be taking early retirement to help care for her parents.  Her daughter has to manage medications, cooking, and household affairs.  She has a good appetite, weight is back up to 100 pounds.  Overall health has been good.  No falls.  Sleeps well.  Enjoys watching TV and doing puzzles.  She has been resistant to the idea of moving into AL or retirement community.  Presents today for evaluation accompanied by her daughter.  HISTORY 06/20/2019 SS: Jessica Benton is an 86 year old female with a progressive memory disturbance.  She lives with her husband.  She remains on Aricept and Namenda, and aspirin.  She is hard of hearing.  She continues to have decline in her memory, trouble with short-term memory, and has repetitive questioning.  She is requiring more daily assistance and supervision from her daughter.  She is able to perform her ADLs, but now requires assistance.  She has a good appetite, her daughter brings meals to her home.  She is sleeping well.  She has not had any falls.  She is a former Pharmacist, hospital, enjoys reading.  Her daughter manages her medications, usually she takes her pills well.  Previous CT scan of the brain showed extensive white matter changes.  She may get agitated, with change from her normal routine.  She presents today for evaluation accompanied by her daughter.   REVIEW OF SYSTEMS: Out of a complete 14 system review of symptoms, the patient complains only of the following symptoms, and all other reviewed systems are negative.  Memory loss  ALLERGIES: Allergies  Allergen Reactions  . Morphine And Related   .  Sulfa Antibiotics     HOME MEDICATIONS: Outpatient Medications Prior to Visit  Medication Sig Dispense Refill  . aspirin EC 81 MG tablet Take 81 mg by mouth daily. 1 in the morning and 1 at night.    . donepezil (ARICEPT) 10 MG tablet Take 1 tablet (10 mg total) by mouth at bedtime. 90 tablet 3  . memantine (NAMENDA) 10 MG tablet Take 1 tablet by mouth twice daily 180 tablet 3   No facility-administered medications prior to visit.    PAST MEDICAL HISTORY: Past Medical History:  Diagnosis Date  . Cancer ([place])   . Cataract   . Cerebrovascular disease 06/13/2018  . Gait disorder 06/13/2018  . Osteoporosis     PAST SURGICAL HISTORY: Past Surgical History:  Procedure Laterality Date  . APPENDECTOMY    . BREAST SURGERY    . EYE SURGERY    . PARTIAL HYSTERECTOMY      FAMILY HISTORY: Family History  Problem Relation Age of Onset  . Parkinsonism Mother   . Dementia Mother   . Heart attack Father     SOCIAL HISTORY: Social History   Socioeconomic History  . Marital status: Married    Spouse name: Not on file  . Number of children: 2  . Years of education: 12+  . Highest education level: Not on file  Occupational History  . Occupation: Retired [employer]  Tobacco Use  . Smoking status: Never Smoker  . Smokeless tobacco: Never  Used  Vaping Use  . Vaping Use: Never used  Substance and Sexual Activity  . Alcohol use: No  . Drug use: No  . Sexual activity: Not on file  Other Topics Concern  . Not on file  Social History Narrative   Lives with husband   Caffeine use: only decaf   Right handed    Social Determinants of Health   Financial Resource Strain:   . Difficulty of Paying Living Expenses:   Food Insecurity:   . Worried About Charity fundraiser in the Last Year:   . Arboriculturist in the Last Year:   Transportation Needs:   . Film/video editor (Medical):   Marland Kitchen Lack of Transportation (Non-Medical):   Physical Activity:   . Days of Exercise  per Week:   . Minutes of Exercise per Session:   Stress:   . Feeling of Stress :   Social Connections:   . Frequency of Communication with Friends and Family:   . Frequency of Social Gatherings with Friends and Family:   . Attends Religious Services:   . Active Member of Clubs or Organizations:   . Attends Archivist Meetings:   Marland Kitchen Marital Status:   Intimate Partner Violence:   . Fear of Current or Ex-Partner:   . Emotionally Abused:   Marland Kitchen Physically Abused:   . Sexually Abused:       PHYSICAL EXAM  Vitals:   12/19/19 1305  BP: (!) 145/74  Pulse: 69  Weight: 100 lb (45.4 kg)   Body mass index is 20.54 kg/m.  Generalized: Well developed, in no acute distress  MMSE - Mini Mental State Exam 06/20/2019 12/14/2018 06/13/2018  Not completed: (No Data) - -  Orientation to time 1 1 0  Orientation to Place 3 3 3   Registration 3 3 3   Attention/ Calculation 1 0 4  Recall 0 0 0  Language- name 2 objects 2 2 2   Language- repeat 1 1 1   Language- follow 3 step command 3 3 3   Language- read & follow direction 1 1 1   Write a sentence 1 1 1   Copy design 0 1 0  Copy design-comments - named 7 animals -  Total score 16 16 18     Neurological examination  Mentation: Alert oriented to time, place, most history is provided by her daughter follows all commands speech and language fluent Cranial nerve II-XII: Pupils were equal round reactive to light. Extraocular movements were full, visual field were full on confrontational test. Facial sensation and strength were normal. Head turning and shoulder shrug  were normal and symmetric. Motor: Good strength to all extremities Sensory: Sensory testing is intact to soft touch on all 4 extremities. No evidence of extinction is noted.  Coordination: Cerebellar testing reveals good finger-nose-finger and heel-to-shin bilaterally.  Gait and station: Has to push off from seated position to stand, slow to rise, gait is wide-based, can walk  independently, but is unsteady Reflexes: Deep tendon reflexes are symmetric   DIAGNOSTIC DATA (LABS, IMAGING, TESTING) - I reviewed patient records, labs, notes, testing and imaging myself where available.  No results found for: WBC, HGB, HCT, MCV, PLT No results found for: NA, K, CL, CO2, GLUCOSE, BUN, CREATININE, CALCIUM, PROT, ALBUMIN, AST, ALT, ALKPHOS, BILITOT, GFRNONAA, GFRAA No results found for: CHOL, HDL, LDLCALC, LDLDIRECT, TRIG, CHOLHDL No results found for: HGBA1C No results found for: VITAMINB12 No results found for: TSH    ASSESSMENT AND PLAN 86 y.o. year  old female  has a past medical history of Cancer [facility]), Cataract, Cerebrovascular disease (06/13/2018), Gait disorder (06/13/2018), and Osteoporosis. here with:  1.  Dementia  Memory continues to have a gradual decline.  She continues to require more assistance.  She will remain on Aricept, Namenda, and aspirin.  CT scan of the brain showed extensive white matter changes.  I will refer her to [facility] for primary care.  Her daughter will be taking early retirement to care for her parents.  She will follow-up in 1 year or sooner if needed.  I spent 30 minutes of face-to-face and non-face-to-face time with patient.  This included previsit chart review, lab review, study review, order entry, electronic health record documentation, patient education.  Butler Denmark, AGNP-C, DNP 12/19/2019, 1:18 PM [facility] [address], [place], [place] [address] [phone]

## 2019-12-19 NOTE — Patient Instructions (Addendum)
Continue on current medications  See you back in 1 year  I have referred you to primary care, [facility]

## 2019-12-20 NOTE — Progress Notes (Signed)
I have read the note, and I agree with the clinical assessment and plan.  Vicy Medico K Alleah Dearman   

## 2020-01-22 ENCOUNTER — Emergency Department (HOSPITAL_BASED_OUTPATIENT_CLINIC_OR_DEPARTMENT_OTHER): Payer: Medicare PPO

## 2020-01-22 ENCOUNTER — Emergency Department (HOSPITAL_BASED_OUTPATIENT_CLINIC_OR_DEPARTMENT_OTHER)
Admission: EM | Admit: 2020-01-22 | Discharge: 2020-01-22 | Disposition: A | Discharge status: Home / Self Care | Payer: Medicare PPO | Attending: Emergency Medicine | Admitting: Emergency Medicine

## 2020-01-22 ENCOUNTER — Other Ambulatory Visit: Payer: Self-pay

## 2020-01-22 ENCOUNTER — Encounter (HOSPITAL_BASED_OUTPATIENT_CLINIC_OR_DEPARTMENT_OTHER): Payer: Self-pay | Admitting: *Deleted

## 2020-01-22 DIAGNOSIS — W1839XA Other fall on same level, initial encounter: Secondary | ICD-10-CM | POA: Diagnosis not present

## 2020-01-22 DIAGNOSIS — Y999 Unspecified external cause status: Secondary | ICD-10-CM | POA: Diagnosis not present

## 2020-01-22 DIAGNOSIS — W19XXXA Unspecified fall, initial encounter: Secondary | ICD-10-CM

## 2020-01-22 DIAGNOSIS — Y939 Activity, unspecified: Secondary | ICD-10-CM | POA: Diagnosis not present

## 2020-01-22 DIAGNOSIS — S4991XA Unspecified injury of right shoulder and upper arm, initial encounter: Secondary | ICD-10-CM | POA: Diagnosis not present

## 2020-01-22 DIAGNOSIS — Y929 Unspecified place or not applicable: Secondary | ICD-10-CM | POA: Insufficient documentation

## 2020-01-22 DIAGNOSIS — M7918 Myalgia, other site: Secondary | ICD-10-CM | POA: Insufficient documentation

## 2020-01-22 DIAGNOSIS — Z7982 Long term (current) use of aspirin: Secondary | ICD-10-CM | POA: Insufficient documentation

## 2020-01-22 DIAGNOSIS — M25511 Pain in right shoulder: Secondary | ICD-10-CM

## 2020-01-22 NOTE — ED Triage Notes (Signed)
Pt co right shoulder injury from fall x 1 day ago

## 2020-01-22 NOTE — ED Provider Notes (Signed)
[facility] Provider Note   CSN: [id number] Arrival date & time: 01/22/20  1300     History Chief Complaint  Patient presents with  . Shoulder Injury    [facility] is a 86 y.o. female.  The history is provided by the patient, a relative and medical records. No language interpreter was used.  Shoulder Injury This is a new problem. The current episode started yesterday. The problem occurs constantly. The problem has been gradually improving. Pertinent negatives include no chest pain, no abdominal pain, no headaches and no shortness of breath. Nothing aggravates the symptoms. Nothing relieves the symptoms. She has tried nothing for the symptoms. The treatment provided no relief.       Past Medical History:  Diagnosis Date  . Cancer ([place])   . Cataract   . Cerebrovascular disease 06/13/2018  . Gait disorder 06/13/2018  . Osteoporosis     Patient Active Problem List   Diagnosis Date Noted  . 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 Gait disorder 06/13/2018    Past Surgical History:  Procedure Laterality Date  . APPENDECTOMY    . BREAST SURGERY    . EYE SURGERY    . PARTIAL HYSTERECTOMY       OB History   No obstetric history on file.     Family History  Problem Relation Age of Onset  . Parkinsonism Mother   . Dementia Mother   . Heart attack Father     Social History   Tobacco Use  . Smoking status: Never Smoker  . Smokeless tobacco: Never Used  Vaping Use  . Vaping Use: Never used  Substance Use Topics  . Alcohol use: No  . Drug use: No    Home Medications Prior to Admission medications   Medication Sig Start Date End Date Taking? Authorizing Provider  aspirin EC 81 MG tablet Take 81 mg by mouth daily. 1 in the morning and 1 at night.    [provider]  donepezil (ARICEPT) 10 MG tablet Take 1 tablet (10 mg total) by mouth at bedtime. 12/19/19   Suzzanne Cloud, NP  memantine [facility]) 10  MG tablet Take 1 tablet by mouth twice daily 12/19/19   Suzzanne Cloud, NP    Allergies    Morphine and related and Sulfa antibiotics  Review of Systems   Review of Systems  Constitutional: Negative for chills and fatigue.  HENT: Negative for congestion.   Respiratory: Negative for chest tightness, shortness of breath and wheezing.   Cardiovascular: Negative for chest pain, palpitations and leg swelling.  Gastrointestinal: Negative for abdominal pain, constipation, diarrhea, nausea and vomiting.  Genitourinary: Negative for dysuria.  Musculoskeletal: Negative for back pain, neck pain and neck stiffness.  Skin: Negative for rash and wound.  Neurological: Negative for weakness, light-headedness, numbness and headaches.  Psychiatric/Behavioral: Negative for agitation.  All other systems reviewed and are negative.   Physical Exam Updated Vital Signs BP (!) 153/77 (BP Location: Left Arm)   Pulse 86   Temp 98.3 F (36.8 C) (Oral)   Resp 18   Ht 4\' 10"  (1.473 m)   Wt 45.8 kg   SpO2 99%   BMI 21.11 kg/m   Physical Exam Vitals and nursing note reviewed.  Constitutional:      General: She is not in acute distress.    Appearance: She is well-developed. She is not ill-appearing, toxic-appearing or diaphoretic.  HENT:     Head: Normocephalic and  atraumatic.     Right Ear: External ear normal.     Left Ear: External ear normal.  Eyes:     Conjunctiva/sclera: Conjunctivae normal.     Pupils: Pupils are equal, round, and reactive to light.  Cardiovascular:     Rate and Rhythm: Normal rate.     Pulses: Normal pulses.     Heart sounds: No murmur heard.   Pulmonary:     Effort: Pulmonary effort is normal. No respiratory distress.     Breath sounds: No stridor. No wheezing, rhonchi or rales.  Chest:     Chest wall: No tenderness.  Abdominal:     General: There is no distension.     Tenderness: There is no abdominal tenderness. There is no right CVA tenderness, left CVA  tenderness, guarding or rebound.  Musculoskeletal:        General: Tenderness present. No swelling or deformity.     Right shoulder: Tenderness present. No deformity, laceration, bony tenderness or crepitus. Normal range of motion. Normal strength. Normal pulse.       Arms:     Cervical back: Normal range of motion and neck supple. No tenderness.     Right lower leg: No edema.     Left lower leg: No edema.  Skin:    General: Skin is warm.     Capillary Refill: Capillary refill takes less than 2 seconds.     Coloration: Skin is not pale.     Findings: No erythema or rash.  Neurological:     General: No focal deficit present.     Mental Status: She is alert and oriented to person, place, and time.     Sensory: No sensory deficit.     Motor: No weakness or abnormal muscle tone.     Deep Tendon Reflexes: Reflexes are normal and symmetric.  Psychiatric:        Mood and Affect: Mood normal.     ED Results / Procedures / Treatments   Labs (all labs ordered are listed, but only abnormal results are displayed) Labs Reviewed - No data to display  EKG None  Radiology DG Shoulder Right  Result Date: 01/22/2020 CLINICAL DATA:  Right shoulder injury after fall yesterday. EXAM: RIGHT SHOULDER - 2+ VIEW COMPARISON:  None. FINDINGS: There is no evidence of fracture. Severe narrowing of subacromial space is noted suggesting chronic rotator cuff injury. Joint spaces are unremarkable. Soft tissues are unremarkable. IMPRESSION: Severe narrowing of subacromial space is noted suggesting chronic rotator cuff injury. No acute abnormality seen in the right shoulder. Electronically Signed   By: Marijo Conception M.D.   On: 01/22/2020 13:32    Procedures Procedures (including critical care time)  Medications Ordered in ED Medications - No data to display  ED Course  I have reviewed the triage vital signs and the nursing notes.  Pertinent labs & imaging results that were available during my care of  the patient were reviewed by me and considered in my medical decision making (see chart for details).    MDM Rules/Calculators/A&P                          Jessica Benton is a 86 y.o. female with a past medical history significant for prior stroke, osteoporosis, Alzheimer's dementia, and prior cancer who presents with right shoulder pain after a fall.  According to family, patient had a fall in the bathroom last night and hit her right  shoulder on the ground.  She complained of some pain this morning but was still able to move her right arm to eat and drink this morning.  She does not have very much pain at rest but when she tries to move it it hurts.  She denies numbness, tingling, or weakness in the extremity.  She denies hitting her head, neck pain, or back pain otherwise.  She denies chest pain, shortness breath, abdominal pain.  No hip or lower extremity pains.  She is right-handed.  She has osteoporosis in the family to make sure she did not have some shoulder fracture.  On exam, patient has some tenderness in the right shoulder superiorly.  She had normal range of motion with pain.  Patient had symmetric grip strength and normal sensation in extremities.  No elbow tenderness with full range of motion and no wrist tenderness.  No snuffbox tenderness.  Normal pulses in radial and ulnar arteries on exam.  Normal sensation including deltoid sensation in the right arm.  No lacerations.  No scapular winging or tenderness on the scapula.  No neck tenderness.  Exam otherwise unremarkable.  Patient had x-ray in triage which showed no fracture or dislocation.  Head showed evidence of likely chronic rotator cuff injury and arthritis.  I suspect she has ligamentous, tendon, or soft tissue injury to the shoulder with no fracture or dislocation.  We discussed using a sling and follow-up with sports medicine or PCP for further management.  Family agrees with plan of care and patient be discharged with good  return precautions.  There are no other questions or concerns and patient discharged in good condition.    Final Clinical Impression(s) / ED Diagnoses Final diagnoses:  Acute pain of right shoulder  Fall, initial encounter    Rx / DC Orders ED Discharge Orders    None     Clinical Impression: 1. Acute pain of right shoulder   2. Fall, initial encounter     Disposition: Discharge  Condition: Good  I have discussed the results, Dx and Tx plan with the pt(& family if present). He/she/they expressed understanding and agree(s) with the plan. Discharge instructions discussed at great length. Strict return precautions discussed and pt &/or family have verbalized understanding of the instructions. No further questions at time of discharge.    New Prescriptions   No medications on file    Follow Up: Rosemarie Ax, MD [address] [place] [id number] [phone]     [facility] [place] EMERGENCY DEPARTMENT [address] [id number] mc [address] [place] [phone]    Bartholome Bill, MD [place] [address] [phone]        Miyana Mordecai, Gwenyth Allegra, MD 01/22/20 [phone]

## 2020-01-22 NOTE — Discharge Instructions (Signed)
Your imaging today did not show evidence of any acute fracture dislocation but as we discussed there could be occult injury or tendon/ligamentous/soft tissue injury.  The x-ray did show evidence of chronic rotator cuff injury and arthritis.  Please use the sling and rest the shoulder and you may use over-the-counter anti-inflammatory medication as we discussed.  Please follow-up with sports medicine or her PCP for further management.  If any symptoms change or worsen, please return to the nearest emergency department.

## 2020-01-28 ENCOUNTER — Emergency Department (HOSPITAL_BASED_OUTPATIENT_CLINIC_OR_DEPARTMENT_OTHER)
Admission: EM | Admit: 2020-01-28 | Discharge: 2020-01-28 | Disposition: A | Discharge status: Home / Self Care | Payer: Medicare PPO | Attending: Emergency Medicine | Admitting: Emergency Medicine

## 2020-01-28 ENCOUNTER — Emergency Department (HOSPITAL_BASED_OUTPATIENT_CLINIC_OR_DEPARTMENT_OTHER): Payer: Medicare PPO

## 2020-01-28 ENCOUNTER — Other Ambulatory Visit: Payer: Self-pay

## 2020-01-28 ENCOUNTER — Encounter (HOSPITAL_BASED_OUTPATIENT_CLINIC_OR_DEPARTMENT_OTHER): Payer: Self-pay | Admitting: Emergency Medicine

## 2020-01-28 DIAGNOSIS — M25511 Pain in right shoulder: Secondary | ICD-10-CM | POA: Insufficient documentation

## 2020-01-28 DIAGNOSIS — F0390 Unspecified dementia without behavioral disturbance: Secondary | ICD-10-CM | POA: Diagnosis not present

## 2020-01-28 DIAGNOSIS — M545 Low back pain, unspecified: Secondary | ICD-10-CM

## 2020-01-28 DIAGNOSIS — Z7982 Long term (current) use of aspirin: Secondary | ICD-10-CM | POA: Diagnosis not present

## 2020-01-28 DIAGNOSIS — Z859 Personal history of malignant neoplasm, unspecified: Secondary | ICD-10-CM | POA: Insufficient documentation

## 2020-01-28 DIAGNOSIS — Z8673 Personal history of transient ischemic attack (TIA), and cerebral infarction without residual deficits: Secondary | ICD-10-CM | POA: Insufficient documentation

## 2020-01-28 DIAGNOSIS — Z79899 Other long term (current) drug therapy: Secondary | ICD-10-CM | POA: Diagnosis not present

## 2020-01-28 NOTE — Discharge Instructions (Addendum)
Tylenol as needed for pain.  The right arm sling is for comfort.  CT of the low back showed no acute bony injuries.  But would recommend following up with sports medicine information provided above for both the back and for follow-up of the right shoulder since she still having pain there.

## 2020-01-28 NOTE — ED Notes (Signed)
Unable to assess pain acuity, pt is primarily nonverbal

## 2020-01-28 NOTE — ED Provider Notes (Signed)
[facility] EMERGENCY DEPARTMENT Provider Note   CSN: [id number] Arrival date & time: 01/28/20  1348     History Chief Complaint  Patient presents with  . Back Pain    Jessica [facility] is a 86 y.o. female.  Patient was seen on August 30 following a fall with complaint of pain to the right shoulder.  The fall occurred on August 29.  Patient had x-rays done of the right shoulder at that time without any bony injuries.  Patient was referred to sports medicine for follow-up but they have not followed up yet.  Patient has a sling on her right arm.  Patient today started complaining of low back pain.  Daughter is concerned that something may have been missed.  Patient is known to have a history of Alzheimer's and osteoporosis.  Apparently no complaint of back pain prior to today.  Patient points to the midportion of her lumbar spine.  As the area of pain.        Past Medical History:  Diagnosis Date  . Cancer ([place])   . Cataract   . Cerebrovascular disease 06/13/2018  . Gait disorder 06/13/2018  . Osteoporosis     Patient Active Problem List   Diagnosis Date Noted  . 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 Gait disorder 06/13/2018    Past Surgical History:  Procedure Laterality Date  . APPENDECTOMY    . BREAST SURGERY    . EYE SURGERY    . PARTIAL HYSTERECTOMY       OB History   No obstetric history on file.     Family History  Problem Relation Age of Onset  . Parkinsonism Mother   . Dementia Mother   . Heart attack Father     Social History   Tobacco Use  . Smoking status: Never Smoker  . Smokeless tobacco: Never Used  Vaping Use  . Vaping Use: Never used  Substance Use Topics  . Alcohol use: No  . Drug use: No    Home Medications Prior to Admission medications   Medication Sig Start Date End Date Taking? Authorizing Provider  aspirin EC 81 MG tablet Take 81 mg by mouth daily. 1 in the morning and 1 at night.    [provider]  donepezil (ARICEPT) 10 MG tablet Take 1 tablet (10 mg total) by mouth at bedtime. 12/19/19   Suzzanne Cloud, NP  memantine [facility]) 10 MG tablet Take 1 tablet by mouth twice daily 12/19/19   Suzzanne Cloud, NP    Allergies    Morphine and related and Sulfa antibiotics  Review of Systems   Review of Systems  Constitutional: Negative for chills and fever.  HENT: Negative for congestion, rhinorrhea and sore throat.   Eyes: Negative for visual disturbance.  Respiratory: Negative for cough and shortness of breath.   Cardiovascular: Negative for chest pain and leg swelling.  Gastrointestinal: Negative for abdominal pain, diarrhea, nausea and vomiting.  Genitourinary: Negative for dysuria.  Musculoskeletal: Positive for back pain. Negative for neck pain.  Skin: Negative for rash.  Neurological: Negative for dizziness, light-headedness and headaches.  Hematological: Does not bruise/bleed easily.  Psychiatric/Behavioral: Negative for confusion.    Physical Exam Updated Vital Signs BP (!) 142/73 (BP Location: Left Arm)   Temp 98 F (36.7 C) (Oral)   SpO2 96%   Physical Exam Vitals and nursing note reviewed.  Constitutional:      General: She is not in acute distress.  Appearance: Normal appearance. She is well-developed.  HENT:     Head: Normocephalic and atraumatic.  Eyes:     Extraocular Movements: Extraocular movements intact.     Conjunctiva/sclera: Conjunctivae normal.     Pupils: Pupils are equal, round, and reactive to light.  Cardiovascular:     Rate and Rhythm: Normal rate and regular rhythm.     Heart sounds: No murmur heard.   Pulmonary:     Effort: Pulmonary effort is normal. No respiratory distress.     Breath sounds: Normal breath sounds.  Abdominal:     Palpations: Abdomen is soft.     Tenderness: There is no abdominal tenderness.  Musculoskeletal:        General: No swelling. Normal range of motion.     Cervical back: Normal range of motion  and neck supple.     Comments: Right arm in a sling.  No obvious bruising or deformity to the right shoulder.  Radial pulse 2+ distally.  Good movement of fingers.  Good cap refill to the fingers.  Lower extremities with good movement.  No weakness.  Some tenderness to palpation to the lumbar spine area midline.  No apparent weakness to lower extremities.  No swelling.  Skin:    General: Skin is warm and dry.     Capillary Refill: Capillary refill takes less than 2 seconds.  Neurological:     Mental Status: She is alert. Mental status is at baseline.     Cranial Nerves: No cranial nerve deficit.     Sensory: No sensory deficit.     Motor: No weakness.     ED Results / Procedures / Treatments   Labs (all labs ordered are listed, but only abnormal results are displayed) Labs Reviewed - No data to display  EKG None  Radiology No results found.  Procedures Procedures (including critical care time)  Medications Ordered in ED Medications - No data to display  ED Course  I have reviewed the triage vital signs and the nursing notes.  Pertinent labs & imaging results that were available during my care of the patient were reviewed by me and considered in my medical decision making (see chart for details).    MDM Rules/Calculators/A&P                           CT scan lumbar spine without evidence of any compression fractures or any acute bony abnormalities.  Lots of degenerative changes.  Plenty of reasons for pain.  Since pain just started today unlikely related to the fall that occurred on August 29.  Patient's daughter does not want to take anything stronger than Tylenol.  Was having continue Tylenol for pain.  And have them follow-up with sports medicine for both the back pain and the persistent shoulder pain.  Did let them know that wearing the sling was optional if the patient does not like it.  Or does not seem to help.      Final Clinical Impression(s) / ED  Diagnoses Final diagnoses:  None    Rx / DC Orders ED Discharge Orders    None       Fredia Sorrow, MD 01/28/20 [phone]

## 2020-01-28 NOTE — ED Notes (Signed)
Recently fell this past Sunday night, seen and treated here in the ED, injury to rt arm/ shoulder and placed in sling. Today presents with back pain. Mid Lower back area, denies any tingling or numbness to lower extremities, unable to describes pain, states "it really hurts"

## 2020-01-28 NOTE — ED Notes (Signed)
Repositioned for comfort and safety of client, FALL RISK BRACELET applied

## 2020-01-28 NOTE — ED Triage Notes (Signed)
Here Monday, s/p fall in right shoulder sling, daughter states that she is saying her back is hurting and unsteady on feet, h/o osteoporosis and Alzheimers

## 2020-01-28 NOTE — ED Notes (Signed)
Noted to have strong plantar and dorsal flexion of Rt and Lt feet.

## 2020-01-31 ENCOUNTER — Telehealth: Payer: Self-pay | Admitting: Family Medicine

## 2020-01-31 NOTE — Telephone Encounter (Signed)
Called pt to offer ED follow-up appt w/Dr. Raeford Razor for back pain .  --glh

## 2020-04-11 ENCOUNTER — Telehealth: Payer: Self-pay | Admitting: Neurology

## 2020-04-11 NOTE — Telephone Encounter (Signed)
Relayed the message from SS/NP to daughter.  She appreciated this.

## 2020-04-11 NOTE — Telephone Encounter (Signed)
I called spoke to daughter.  Pt is having some equilibrium problems, does have some knee problems.   She is asking if this is related to ALZ dementia.  I relayed I did not think so, she is  Not having  dizziness, pt and husband has declined physically so she is having to care for both of them )alternating between there home and hers (they stay at her house at night.).  Is looking into AL.  Last seen 11-2019.

## 2020-04-11 NOTE — Telephone Encounter (Signed)
Pt.'s daughter Trinidad Curet is on [place]. She states she's noticing some things with mom & would like for her to be checked out. Please advise.

## 2020-04-11 NOTE — Telephone Encounter (Signed)
I don't think this (disequilibrium) is related to her dementia, but may be related to her overall health if she is declining. May benefit from seeing PCP if this has been a rather acute change.

## 2020-10-29 ENCOUNTER — Telehealth: Payer: Self-pay | Admitting: Neurology

## 2020-10-29 NOTE — Telephone Encounter (Signed)
Called daughter , LVM requesting call back.

## 2020-10-29 NOTE — Telephone Encounter (Signed)
Daughter returned call. Please call back when available.

## 2020-10-29 NOTE — Telephone Encounter (Signed)
Called daughter, LVM requesting call back.

## 2020-10-29 NOTE — Telephone Encounter (Signed)
Pt's daughter Earnest Bailey called stating that she is needing to speak to the RN or Provider about some changes in her mother's behavior and demeanor. She would like to be advised on what can be done about these changes.

## 2020-10-29 NOTE — Telephone Encounter (Signed)
Daughter returned call, stated someone from [facility] came to do assessment yesterday because children want to move parents there. Daughter stated mom got very agitated,  angry, was yelling. Daughter wants to know what to do, concerned if they take parents to home they'll refuse to stay. They were scheduled to move to home  in Jan but refused the day the movers came.  Holly asked if something can be prescribed to calm her down.  Patient has FU in July; daughter asked for sooner FU. Rescheduled for tomorrow, daughter requested July FU not be canceled in case she can't get patient to come tomorrow. Earnest Bailey will call us if patient refuses, and she'll bring her in July instead. Holly verbalized understanding, appreciation.

## 2020-10-30 ENCOUNTER — Telehealth: Payer: Self-pay | Admitting: Neurology

## 2020-10-30 ENCOUNTER — Ambulatory Visit: Payer: Medicare PPO | Admitting: Neurology

## 2020-10-30 ENCOUNTER — Encounter: Payer: Self-pay | Admitting: Neurology

## 2020-10-30 VITALS — BP 170/90 | HR 108 | Ht <= 58 in | Wt 109.0 lb

## 2020-10-30 DIAGNOSIS — G309 Alzheimer's disease, unspecified: Secondary | ICD-10-CM

## 2020-10-30 DIAGNOSIS — F0280 Dementia in other diseases classified elsewhere without behavioral disturbance: Secondary | ICD-10-CM

## 2020-10-30 DIAGNOSIS — F0150 Vascular dementia without behavioral disturbance: Secondary | ICD-10-CM

## 2020-10-30 MED ORDER — MEMANTINE HCL 10 MG PO TABS: ORAL_TABLET | ORAL | 3 refills | Status: DC

## 2020-10-30 MED ORDER — DONEPEZIL HCL 10 MG PO TABS
10.0000 mg | ORAL_TABLET | Freq: Every day | ORAL | 3 refills | Status: DC
Start: 2020-10-30 — End: 2023-08-20

## 2020-10-30 MED ORDER — SERTRALINE HCL 25 MG PO TABS: 25.0000 mg | ORAL_TABLET | Freq: Every day | ORAL | 5 refills | Status: DC

## 2020-10-30 NOTE — Telephone Encounter (Signed)
[facility] [employer] has called asking the RN of [name],NP be on the lookout for forms from them since pt is becoming a resident of theis.  If need be Sharyn Lull can be reached at [phone]

## 2020-10-30 NOTE — Telephone Encounter (Signed)
Noted  

## 2020-10-30 NOTE — Telephone Encounter (Signed)
I called Sharyn Lull at [employer].  She will refax to my pod fax [phone].

## 2020-10-30 NOTE — Patient Instructions (Signed)
Start Zoloft 25 mg daily to help with the mood  Continue Aricept and Namenda See you back in 6 months, okay to establish with doctors at [facility]

## 2020-10-30 NOTE — Progress Notes (Signed)
I have read the note, and I agree with the clinical assessment and plan.  Cailyn Houdek K Tra Wilemon   

## 2020-10-30 NOTE — Progress Notes (Signed)
PATIENT: Jessica Benton DOB: [birth year]  REASON FOR VISIT: follow up HISTORY FROM: patient  HISTORY OF PRESENT ILLNESS: Today 10/30/20  Jessica Benton is a 87 year old female with history of progressive memory disturbance.  This is an early follow-up. Moving to [place] next week. Needing more supervision, help at home. Both her and her husband having more health problems. Moving to AL together. Daughter having more trouble caring for them at home. She gets agitated, when she is told what to do. Isn't sure how she will react going to AL, thinks both her parents will be unhappy.  Remains on Aricept and Namenda. Unsteady on her feet, refuses to use cane or walker. Fell on buttocks today going to bathroom.  MMSE 17/30.  Needs assistance with her ADLs.  Here today accompanied by her daughter, [facility]. She didn't want to come today.   Update 12/19/2019 SS: Jessica Benton is an 87 year old female with history of progressive memory disturbance.  She is on Aricept, Namenda, and aspirin.  She lives with her husband.  She requires more supervision.  Her daughter has been out on FMLA, is going to be taking early retirement to help care for her parents.  Her daughter has to manage medications, cooking, and household affairs.  She has a good appetite, weight is back up to 100 pounds.  Overall health has been good.  No falls.  Sleeps well.  Enjoys watching TV and doing puzzles.  She has been resistant to the idea of moving into AL or retirement community.  Presents today for evaluation accompanied by her daughter.  HISTORY 06/20/2019 SS: Jessica Benton is an 87 year old female with a progressive memory disturbance.  She lives with her husband.  She remains on Aricept and Namenda, and aspirin.  She is hard of hearing.  She continues to have decline in her memory, trouble with short-term memory, and has repetitive questioning.  She is requiring more daily assistance and supervision from her daughter.  She is able  to perform her ADLs, but now requires assistance.  She has a good appetite, her daughter brings meals to her home.  She is sleeping well.  She has not had any falls.  She is a former Pharmacist, hospital, enjoys reading.  Her daughter manages her medications, usually she takes her pills well.  Previous CT scan of the brain showed extensive white matter changes.  She may get agitated, with change from her normal routine.  She presents today for evaluation accompanied by her daughter.   REVIEW OF SYSTEMS: Out of a complete 14 system review of symptoms, the patient complains only of the following symptoms, and all other reviewed systems are negative.  Memory loss  ALLERGIES: Allergies  Allergen Reactions  . Morphine And Related   . Sulfa Antibiotics     HOME MEDICATIONS: Outpatient Medications Prior to Visit  Medication Sig Dispense Refill  . aspirin EC 81 MG tablet Take 81 mg by mouth daily. 1 in the morning and 1 at night.    . donepezil (ARICEPT) 10 MG tablet Take 1 tablet (10 mg total) by mouth at bedtime. 90 tablet 3  . memantine (NAMENDA) 10 MG tablet Take 1 tablet by mouth twice daily 180 tablet 3   No facility-administered medications prior to visit.    PAST MEDICAL HISTORY: Past Medical History:  Diagnosis Date  . Cancer ([place])   . Cataract   . Cerebrovascular disease 06/13/2018  . Gait disorder 06/13/2018  . Osteoporosis  PAST SURGICAL HISTORY: Past Surgical History:  Procedure Laterality Date  . APPENDECTOMY    . BREAST SURGERY    . EYE SURGERY    . PARTIAL HYSTERECTOMY      FAMILY HISTORY: Family History  Problem Relation Age of Onset  . Parkinsonism Mother   . Dementia Mother   . Heart attack Father     SOCIAL HISTORY: Social History   Socioeconomic History  . Marital status: Married    Spouse name: Not on file  . Number of children: 2  . Years of education: 12+  . Highest education level: Not on file  Occupational History  . 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 Smoking status: Never Smoker  . Smokeless tobacco: Never Used  Vaping Use  . Vaping Use: Never used  Substance and Sexual Activity  . Alcohol use: No  . Drug use: No  . Sexual activity: Not on file  Other Topics Concern  . Not on file  Social History Narrative   Lives with husband   Caffeine use: only decaf   Right handed    Social Determinants of Health   Financial Resource Strain: Not on file  Food Insecurity: Not on file  Transportation Needs: Not on file  Physical Activity: Not on file  Stress: Not on file  Social Connections: Not on file  Intimate Partner Violence: Not on file      PHYSICAL EXAM  Vitals:   10/30/20 1411  BP: (!) 170/90  Pulse: (!) 108  Weight: 109 lb (49.4 kg)  Height: 4\' 10"  (1.473 m)   Body mass index is 22.78 kg/m.  Generalized: Well developed, in no acute distress  MMSE - Mini Mental State Exam 10/30/2020 06/20/2019 12/14/2018  Not completed: - (No Data) -  Orientation to time 1 1 1   Orientation to Place 4 3 3   Registration 3 3 3   Attention/ Calculation 1 1 0  Recall 0 0 0  Language- name 2 objects 2 2 2   Language- repeat 1 1 1   Language- follow 3 step command 3 3 3   Language- read & follow direction 1 1 1   Write a sentence 1 1 1   Copy design 0 0 1  Copy design-comments - - named 7 animals  Total score 17 16 16    Neurological examination  Mentation: Alert, fairly cooperative, relies on her daughter for history Cranial nerve II-XII: Pupils were equal round reactive to light. Extraocular movements were full, visual field were full on confrontational test. Facial sensation and strength were normal. Head turning and shoulder shrug  were normal and symmetric. Motor: Good strength to all extremities Sensory: Sensory testing is intact to soft touch on all 4 extremities. No evidence of extinction is noted.  Coordination: Cerebellar testing reveals good finger-nose-finger and heel-to-shin bilaterally.  Gait and  station: Has to push off from seated position to stand, slow to rise, gait is wide-based, can walk independently, but is unsteady, forward leaning Reflexes: Deep tendon reflexes are symmetric   DIAGNOSTIC DATA (LABS, IMAGING, TESTING) - I reviewed patient records, labs, notes, testing and imaging myself where available.  No results found for: WBC, HGB, HCT, MCV, PLT No results found for: NA, K, CL, CO2, GLUCOSE, BUN, CREATININE, CALCIUM, PROT, ALBUMIN, AST, ALT, ALKPHOS, BILITOT, GFRNONAA, GFRAA No results found for: CHOL, HDL, LDLCALC, LDLDIRECT, TRIG, CHOLHDL No results found for: HGBA1C No results found for: VITAMINB12 No results found for: TSH    ASSESSMENT AND PLAN 87 y.o.  year old female  has a past medical history of Cancer [facility]), Cataract, Cerebrovascular disease (06/13/2018), Gait disorder (06/13/2018), and Osteoporosis. here with:  1.  Dementia  -Moving to [place] next week at [facility], concern about anxiety/agitation  -Add on Zoloft 25 mg daily to help with mood stability -Continue Aricept, Namenda -Is fall risk, stronger medications can have adverse effect, but may be necessary, facility physician care team may be best to manage going forward, is difficult to get her to these appointments -Follow-up in 6 months or sooner if needed  I spent 35 minutes of face-to-face and non-face-to-face time with patient.  This included previsit chart review, discussing move to AL, medications, side effects, plan and follow-up going forward.   Butler Denmark, AGNP-C, DNP 10/30/2020, 2:43 PM [facility] [address], [place], [place] [address] [phone]

## 2020-10-31 NOTE — Telephone Encounter (Signed)
Received information from [employer].  This was for patient going to reside at the facility.  FL 2 and other paperwork.  Per V[facility]cher this should be done by primary care physician.  Refaxed the form to Alva at [facility] [phone].  Their phone #[phone].  Fax confirmation received.

## 2020-11-28 ENCOUNTER — Emergency Department (HOSPITAL_COMMUNITY): Payer: Medicare PPO

## 2020-11-28 ENCOUNTER — Encounter (HOSPITAL_COMMUNITY): Payer: Self-pay | Admitting: Emergency Medicine

## 2020-11-28 ENCOUNTER — Other Ambulatory Visit: Payer: Self-pay

## 2020-11-28 ENCOUNTER — Emergency Department (HOSPITAL_COMMUNITY)
Admission: EM | Admit: 2020-11-28 | Discharge: 2020-11-28 | Disposition: A | Discharge status: Home / Self Care | Payer: Medicare PPO | Attending: Emergency Medicine | Admitting: Emergency Medicine

## 2020-11-28 DIAGNOSIS — R079 Chest pain, unspecified: Secondary | ICD-10-CM

## 2020-11-28 DIAGNOSIS — Z859 Personal history of malignant neoplasm, unspecified: Secondary | ICD-10-CM | POA: Diagnosis not present

## 2020-11-28 DIAGNOSIS — R600 Localized edema: Secondary | ICD-10-CM | POA: Diagnosis not present

## 2020-11-28 DIAGNOSIS — Z7982 Long term (current) use of aspirin: Secondary | ICD-10-CM | POA: Insufficient documentation

## 2020-11-28 DIAGNOSIS — G309 Alzheimer's disease, unspecified: Secondary | ICD-10-CM | POA: Insufficient documentation

## 2020-11-28 DIAGNOSIS — R0789 Other chest pain: Secondary | ICD-10-CM | POA: Diagnosis not present

## 2020-11-28 DIAGNOSIS — R0602 Shortness of breath: Secondary | ICD-10-CM | POA: Diagnosis not present

## 2020-11-28 LAB — CBC WITH DIFFERENTIAL/PLATELET
Abs Immature Granulocytes: 0.06 10*3/uL (ref 0.00–0.07)
Basophils Absolute: 0.1 10*3/uL (ref 0.0–0.1)
Basophils Relative: 1 %
Eosinophils Absolute: 0.2 10*3/uL (ref 0.0–0.5)
Eosinophils Relative: 3 %
HCT: 40.7 % (ref 36.0–46.0)
Hemoglobin: 13 g/dL (ref 12.0–15.0)
Immature Granulocytes: 1 %
Lymphocytes Relative: 24 %
Lymphs Abs: 1.9 10*3/uL (ref 0.7–4.0)
MCH: 31.2 pg (ref 26.0–34.0)
MCHC: 31.9 g/dL (ref 30.0–36.0)
MCV: 97.6 fL (ref 80.0–100.0)
Monocytes Absolute: 0.8 10*3/uL (ref 0.1–1.0)
Monocytes Relative: 10 %
Neutro Abs: 4.9 10*3/uL (ref 1.7–7.7)
Neutrophils Relative %: 61 %
Platelets: 228 10*3/uL (ref 150–400)
RBC: 4.17 MIL/uL (ref 3.87–5.11)
RDW: 13.9 % (ref 11.5–15.5)
WBC: 7.9 10*3/uL (ref 4.0–10.5)
nRBC: 0 % (ref 0.0–0.2)

## 2020-11-28 LAB — TROPONIN I (HIGH SENSITIVITY): Troponin I (High Sensitivity): 9 ng/L (ref <18)

## 2020-11-28 LAB — BASIC METABOLIC PANEL
Anion gap: 7 (ref 5–15)
BUN: 15 mg/dL (ref 8–23)
CO2: 26 mmol/L (ref 22–32)
Calcium: 9.2 mg/dL (ref 8.9–10.3)
Chloride: 106 mmol/L (ref 98–111)
Creatinine, Ser: 1.07 mg/dL — ABNORMAL HIGH (ref 0.44–1.00)
GFR, Estimated: 49 mL/min — ABNORMAL LOW (ref >60)
Glucose, Bld: 94 mg/dL (ref 70–99)
Potassium: 3.6 mmol/L (ref 3.5–5.1)
Sodium: 139 mmol/L (ref 135–145)

## 2020-11-28 LAB — BRAIN NATRIURETIC PEPTIDE: B Natriuretic Peptide: 307 pg/mL — ABNORMAL HIGH (ref 0.0–100.0)

## 2020-11-28 NOTE — ED Triage Notes (Signed)
Pt presents to ED BIB [facility] from [facility]. Pt c/o CP. Pt 88% on RA, EMS placed pt on 2L 96%. EMS EKG NS 138/100 HR - 80 CBG - 95

## 2020-11-28 NOTE — ED Provider Notes (Signed)
Emergency Medicine Provider Triage Evaluation Note  Jessica Benton , a 87 y.o. female  was evaluated in triage.  Pt trasnported from [facility] for c/o chest pain. Asymptomatic at this time. Hx of dementia with no recollection of her chest pain this evening. Denies SOB, abdominal pain.  Review of Systems  Positive: Chest pain Negative: SOB, abdominal pain  Physical Exam  There were no vitals taken for this visit. Gen:   Awake, no distress. Frail appearing. Resp:  Normal effort MSK:   Moves extremities symmetrically Other:  Heart RRR with SEM  Medical Decision Making  Medically screening exam initiated at 5:01 AM.  Appropriate orders placed.  Jessica Benton was informed that the remainder of the evaluation will be completed by another provider, this initial triage assessment does not replace that evaluation, and the importance of remaining in the ED until their evaluation is complete.  Chest pain   Antonietta Breach, PA-C 11/28/20 0505    Ezequiel Essex, MD 11/28/20 [phone]

## 2020-11-28 NOTE — Discharge Instructions (Addendum)
The testing today did not show signs of heart attack.  We sent a blood test called BNP to evaluate for the possibility of heart failure.  It is unlikely that she is having heart failure, or that this is causing her discomfort today.  Encouraged her to rest, eat regularly and drink plenty of fluids.  Return here if needed, for problems.

## 2020-11-28 NOTE — ED Provider Notes (Signed)
[facility] EMERGENCY DEPARTMENT Provider Note   CSN: [id number] Arrival date & time: 11/28/20  0453     History Chief Complaint  Patient presents with   Chest Pain    Jessica Benton is a 87 y.o. female.  HPI Presents via EMS for evaluation of shortness of breath, found to be hypoxic on room air at 88%.  She is placed on nasal cannula oxygen with improvement in 96%.  The patient has dementia and cannot give history.  The patient's daughter is here and gives history that the patient has been agitated since being placed in a nursing care facility, 3 weeks ago.  Daughter is concerned that the patient is not adjusting well to the new facility.  Apparently they were trying to put her in a memory care unit at that facility.  There are no other reported recent problems.   Level 5 caveat-dementia    Past Medical History:  Diagnosis Date   Cancer [facility])    Cataract    Cerebrovascular disease 06/13/2018   Gait disorder 06/13/2018   Osteoporosis     Patient Active Problem List   Diagnosis Date Noted   Mixed Alzheimer's and vascular dementia ([place]) 06/13/2018   Gait disorder 06/13/2018    Past Surgical History:  Procedure Laterality Date   APPENDECTOMY     BREAST SURGERY     EYE SURGERY     PARTIAL HYSTERECTOMY       OB History   No obstetric history on file.     Family History  Problem Relation Age of Onset   Parkinsonism Mother    Dementia Mother    Heart attack Father     Social History   Tobacco Use   Smoking status: Never   Smokeless tobacco: Never  Vaping Use   Vaping Use: Never used  Substance Use Topics   Alcohol use: No   Drug use: No    Home Medications Prior to Admission medications   Medication Sig Start Date End Date Taking? Authorizing Provider  aspirin EC 81 MG tablet Take 81 mg by mouth daily. 1 in the morning and 1 at night.    [provider]  donepezil (ARICEPT) 10 MG tablet Take 1 tablet (10 mg total) by mouth at  bedtime. 10/30/20   Suzzanne Cloud, NP  memantine [facility]) 10 MG tablet Take 1 tablet by mouth twice daily 10/30/20   Suzzanne Cloud, NP  sertraline (ZOLOFT) 25 MG tablet Take 1 tablet (25 mg total) by mouth daily. 10/30/20   Suzzanne Cloud, NP    Allergies    Morphine and related and Sulfa antibiotics  Review of Systems   Review of Systems  Unable to perform ROS: Dementia   Physical Exam Updated Vital Signs BP (!) 157/76 (BP Location: Right Arm)   Pulse 89   Temp 98 F (36.7 C) (Oral)   Resp 15   SpO2 93%   Physical Exam Vitals and nursing note reviewed.  Constitutional:      Appearance: She is well-developed. She is not ill-appearing, toxic-appearing or diaphoretic.  HENT:     Head: Normocephalic and atraumatic.     Right Ear: External ear normal.     Left Ear: External ear normal.     Nose: No congestion or rhinorrhea.     Mouth/Throat:     Pharynx: No oropharyngeal exudate or posterior oropharyngeal erythema.  Eyes:     Conjunctiva/sclera: Conjunctivae normal.     Pupils: Pupils  are equal, round, and reactive to light.  Neck:     Trachea: Phonation normal.  Cardiovascular:     Rate and Rhythm: Normal rate and regular rhythm.     Heart sounds: Normal heart sounds.     Comments: No JVD.  1-2+ pitting edema of the lower legs bilaterally. Pulmonary:     Effort: Pulmonary effort is normal.     Breath sounds: Normal breath sounds.  Abdominal:     General: There is no distension.     Palpations: Abdomen is soft.     Tenderness: There is no abdominal tenderness.  Musculoskeletal:        General: Normal range of motion.     Cervical back: Normal range of motion and neck supple.  Skin:    General: Skin is warm and dry.  Neurological:     Mental Status: She is alert and oriented to person, place, and time.     Cranial Nerves: No cranial nerve deficit.     Sensory: No sensory deficit.     Motor: No abnormal muscle tone.     Coordination: Coordination normal.   Psychiatric:        Mood and Affect: Mood normal.        Behavior: Behavior normal.    ED Results / Procedures / Treatments   Labs (all labs ordered are listed, but only abnormal results are displayed) Labs Reviewed  BASIC METABOLIC PANEL - Abnormal; Notable for the following components:      Result Value   Creatinine, Ser 1.07 (*)    GFR, Estimated 49 (*)    All other components within normal limits  CBC WITH DIFFERENTIAL/PLATELET  TROPONIN I (HIGH SENSITIVITY)    EKG EKG Interpretation  Date/Time:  Thursday November 28 2020 05:06:53 EDT Ventricular Rate:  81 PR Interval:  156 QRS Duration: 84 QT Interval:  398 QTC Calculation: 462 R Axis:   53 Text Interpretation: Sinus rhythm Confirmed by Octaviano Glow [phone]) on 11/28/2020 7:29:31 AM  Radiology DG Chest 2 View  Result Date: 11/28/2020 CLINICAL DATA:  87 year old female with history of chest pain. EXAM: CHEST - 2 VIEW COMPARISON:  Chest x-ray 07/27/2007. FINDINGS: Lung volumes are low. No consolidative airspace disease. No definite pleural effusions. There is cephalization of the pulmonary vasculature and slight indistinctness of the interstitial markings suggestive of mild pulmonary edema. No pneumothorax. Mild cardiomegaly. Severe calcifications of the mitral annulus. Upper mediastinal contours are within normal limits allowing for patient positioning. Atherosclerotic calcifications in the thoracic aorta. Surgical clips project over the upper abdomen. IMPRESSION: 1. The appearance the chest suggests mild congestive heart failure, as above. 2. Aortic atherosclerosis. 3. Severe calcifications of the mitral annulus. Electronically Signed   By: Vinnie Langton M.D.   On: 11/28/2020 05:39    Procedures Procedures   Medications Ordered in ED Medications - No data to display  ED Course  I have reviewed the triage vital signs and the nursing notes.  Pertinent labs & imaging results that were available during my care of the  patient were reviewed by me and considered in my medical decision making (see chart for details).    MDM Rules/Calculators/A&P                           Patient Vitals for the past 24 hrs:  BP Temp Temp src Pulse Resp SpO2  11/28/20 1337 (!) 157/76 -- -- 89 15 93 %  11/28/20 1034 (!)  144/76 98 F (36.7 C) Oral 73 17 96 %  11/28/20 0750 (!) 176/91 -- -- 79 15 94 %  11/28/20 0510 (!) 177/96 97.6 F (36.4 C) Oral 79 19 95 %    4:15 PM Reevaluation with update and discussion. After initial assessment and treatment, an updated evaluation reveals no change in clinical status.  Oxygenation 94 to 95% room air.  BMP sent to evaluate for abnormal chest findings indicating mild CHF.  Clinically patient does not have CHF.  These findings were discussed with the patient's daughter at the bedside.  Outpatient follow-up recommended.  All questions answered. Daleen Bo   Medical Decision Making:  This patient is presenting for evaluation of chest discomfort, which does require a range of treatment options, and is a complaint that involves a moderate risk of morbidity and mortality. The differential diagnoses include ACS, chest wall pain, nonspecific pain. I decided to review old records, and in summary advanced elderly patient presenting for nonspecific complaints.  She is unable to give any history.  She is reportedly hypoxic on the scene but was evaluated in ED and found to not be hypoxic.  I did not require additional historical information from anyone.  Clinical Laboratory Tests Ordered, included CBC, Metabolic panel, and troponin . Review indicates no acute abnormality. Radiologic Tests Ordered, included chest x-ray.  I independently Visualized: Radiograph images, which show possible mild CHF    Critical Interventions-evaluation, laboratory testing, radiography, observation and reassessment  After These Interventions, the Patient was reevaluated and was found stable for discharge.  Doubt  ACS, MI, respiratory compromise or failure, metabolic disorder.  Nonspecific chest x-ray findings.  BNP sent for trending.  No indication for hospitalization at this time.  CRITICAL CARE- no Performed by: Daleen Bo  Nursing Notes Reviewed/ Care Coordinated Applicable Imaging Reviewed Interpretation of Laboratory Data incorporated into ED treatment  The patient appears reasonably screened and/or stabilized for discharge and I doubt any other medical condition or other [facility] requiring further screening, evaluation, or treatment in the ED at this time prior to discharge.  Plan: Home Medications-continue usual medications and treatment; Home Treatments-rest, fluids; return here if the recommended treatment, does not improve the symptoms; Recommended follow up-PCP, PRN      Final Clinical Impression(s) / ED Diagnoses Final diagnoses:  Nonspecific chest pain    Rx / DC Orders ED Discharge Orders     None        Daleen Bo, MD 11/28/20 1620

## 2020-12-17 ENCOUNTER — Other Ambulatory Visit: Payer: Self-pay

## 2020-12-17 ENCOUNTER — Emergency Department (HOSPITAL_COMMUNITY): Payer: Medicare PPO

## 2020-12-17 ENCOUNTER — Encounter (HOSPITAL_COMMUNITY): Payer: Self-pay

## 2020-12-17 ENCOUNTER — Inpatient Hospital Stay (HOSPITAL_COMMUNITY)
Admission: EM | Admit: 2020-12-17 | Discharge: 2020-12-23 | DRG: 871 | Disposition: A | Discharge status: Hospice | Payer: Medicare PPO | Source: Skilled Nursing Facility | Attending: Internal Medicine | Admitting: Internal Medicine

## 2020-12-17 DIAGNOSIS — Z66 Do not resuscitate: Secondary | ICD-10-CM | POA: Diagnosis present

## 2020-12-17 DIAGNOSIS — R509 Fever, unspecified: Secondary | ICD-10-CM | POA: Diagnosis present

## 2020-12-17 DIAGNOSIS — E876 Hypokalemia: Secondary | ICD-10-CM | POA: Diagnosis not present

## 2020-12-17 DIAGNOSIS — Z8249 Family history of ischemic heart disease and other diseases of the circulatory system: Secondary | ICD-10-CM | POA: Diagnosis not present

## 2020-12-17 DIAGNOSIS — J9601 Acute respiratory failure with hypoxia: Secondary | ICD-10-CM | POA: Diagnosis present

## 2020-12-17 DIAGNOSIS — H9190 Unspecified hearing loss, unspecified ear: Secondary | ICD-10-CM | POA: Diagnosis present

## 2020-12-17 DIAGNOSIS — Z681 Body mass index (BMI) 19 or less, adult: Secondary | ICD-10-CM | POA: Diagnosis not present

## 2020-12-17 DIAGNOSIS — R6520 Severe sepsis without septic shock: Secondary | ICD-10-CM | POA: Diagnosis present

## 2020-12-17 DIAGNOSIS — F0150 Vascular dementia without behavioral disturbance: Secondary | ICD-10-CM | POA: Diagnosis present

## 2020-12-17 DIAGNOSIS — G309 Alzheimer's disease, unspecified: Secondary | ICD-10-CM | POA: Diagnosis present

## 2020-12-17 DIAGNOSIS — Z515 Encounter for palliative care: Secondary | ICD-10-CM | POA: Diagnosis not present

## 2020-12-17 DIAGNOSIS — Z7189 Other specified counseling: Secondary | ICD-10-CM | POA: Diagnosis not present

## 2020-12-17 DIAGNOSIS — Z20822 Contact with and (suspected) exposure to covid-19: Secondary | ICD-10-CM | POA: Diagnosis present

## 2020-12-17 DIAGNOSIS — N12 Tubulo-interstitial nephritis, not specified as acute or chronic: Secondary | ICD-10-CM | POA: Diagnosis present

## 2020-12-17 DIAGNOSIS — M810 Age-related osteoporosis without current pathological fracture: Secondary | ICD-10-CM | POA: Diagnosis present

## 2020-12-17 DIAGNOSIS — A419 Sepsis, unspecified organism: Principal | ICD-10-CM | POA: Diagnosis present

## 2020-12-17 DIAGNOSIS — E8809 Other disorders of plasma-protein metabolism, not elsewhere classified: Secondary | ICD-10-CM | POA: Diagnosis present

## 2020-12-17 DIAGNOSIS — I679 Cerebrovascular disease, unspecified: Secondary | ICD-10-CM | POA: Diagnosis present

## 2020-12-17 DIAGNOSIS — F0280 Dementia in other diseases classified elsewhere without behavioral disturbance: Secondary | ICD-10-CM | POA: Diagnosis present

## 2020-12-17 DIAGNOSIS — Z90711 Acquired absence of uterus with remaining cervical stump: Secondary | ICD-10-CM

## 2020-12-17 DIAGNOSIS — Z79899 Other long term (current) drug therapy: Secondary | ICD-10-CM

## 2020-12-17 DIAGNOSIS — R0602 Shortness of breath: Secondary | ICD-10-CM

## 2020-12-17 DIAGNOSIS — R627 Adult failure to thrive: Secondary | ICD-10-CM | POA: Diagnosis present

## 2020-12-17 DIAGNOSIS — Z7982 Long term (current) use of aspirin: Secondary | ICD-10-CM | POA: Diagnosis not present

## 2020-12-17 DIAGNOSIS — E43 Unspecified severe protein-calorie malnutrition: Secondary | ICD-10-CM | POA: Diagnosis present

## 2020-12-17 DIAGNOSIS — I700 Atherosclerosis of aorta: Secondary | ICD-10-CM | POA: Diagnosis present

## 2020-12-17 DIAGNOSIS — J189 Pneumonia, unspecified organism: Secondary | ICD-10-CM | POA: Diagnosis present

## 2020-12-17 DIAGNOSIS — Z820 Family history of epilepsy and other diseases of the nervous system: Secondary | ICD-10-CM

## 2020-12-17 DIAGNOSIS — N390 Urinary tract infection, site not specified: Secondary | ICD-10-CM | POA: Diagnosis not present

## 2020-12-17 LAB — COMPREHENSIVE METABOLIC PANEL
ALT: 18 U/L (ref 0–44)
AST: 28 U/L (ref 15–41)
Albumin: 2.7 g/dL — ABNORMAL LOW (ref 3.5–5.0)
Alkaline Phosphatase: 116 U/L (ref 38–126)
Anion gap: 9 (ref 5–15)
BUN: 25 mg/dL — ABNORMAL HIGH (ref 8–23)
CO2: 26 mmol/L (ref 22–32)
Calcium: 8.4 mg/dL — ABNORMAL LOW (ref 8.9–10.3)
Chloride: 105 mmol/L (ref 98–111)
Creatinine, Ser: 0.98 mg/dL (ref 0.44–1.00)
GFR, Estimated: 55 mL/min — ABNORMAL LOW (ref >60)
Glucose, Bld: 150 mg/dL — ABNORMAL HIGH (ref 70–99)
Potassium: 4.1 mmol/L (ref 3.5–5.1)
Sodium: 140 mmol/L (ref 135–145)
Total Bilirubin: 1 mg/dL (ref 0.3–1.2)
Total Protein: 6 g/dL — ABNORMAL LOW (ref 6.5–8.1)

## 2020-12-17 LAB — URINALYSIS, ROUTINE W REFLEX MICROSCOPIC
Bilirubin Urine: NEGATIVE
Glucose, UA: NEGATIVE mg/dL
Hgb urine dipstick: NEGATIVE
Ketones, ur: NEGATIVE mg/dL
Nitrite: POSITIVE — AB
Protein, ur: 30 mg/dL — AB
Specific Gravity, Urine: 1.011 (ref 1.005–1.030)
pH: 9 — ABNORMAL HIGH (ref 5.0–8.0)

## 2020-12-17 LAB — APTT: aPTT: 26 seconds (ref 24–36)

## 2020-12-17 LAB — CBC WITH DIFFERENTIAL/PLATELET
Abs Immature Granulocytes: 0.18 10*3/uL — ABNORMAL HIGH (ref 0.00–0.07)
Basophils Absolute: 0 10*3/uL (ref 0.0–0.1)
Basophils Relative: 0 %
Eosinophils Absolute: 0 10*3/uL (ref 0.0–0.5)
Eosinophils Relative: 0 %
HCT: 42.8 % (ref 36.0–46.0)
Hemoglobin: 13.5 g/dL (ref 12.0–15.0)
Immature Granulocytes: 1 %
Lymphocytes Relative: 6 %
Lymphs Abs: 1 10*3/uL (ref 0.7–4.0)
MCH: 30.3 pg (ref 26.0–34.0)
MCHC: 31.5 g/dL (ref 30.0–36.0)
MCV: 96.2 fL (ref 80.0–100.0)
Monocytes Absolute: 1.8 10*3/uL — ABNORMAL HIGH (ref 0.1–1.0)
Monocytes Relative: 11 %
Neutro Abs: 13.6 10*3/uL — ABNORMAL HIGH (ref 1.7–7.7)
Neutrophils Relative %: 82 %
Platelets: 278 10*3/uL (ref 150–400)
RBC: 4.45 MIL/uL (ref 3.87–5.11)
RDW: 13.9 % (ref 11.5–15.5)
WBC: 16.6 10*3/uL — ABNORMAL HIGH (ref 4.0–10.5)
nRBC: 0 % (ref 0.0–0.2)

## 2020-12-17 LAB — LACTIC ACID, PLASMA
Lactic Acid, Venous: 1.7 mmol/L (ref 0.5–1.9)
Lactic Acid, Venous: 1.2 mmol/L (ref 0.5–1.9)

## 2020-12-17 LAB — RESP PANEL BY RT-PCR (FLU A&B, COVID) ARPGX2
Influenza A by PCR: NEGATIVE
Influenza B by PCR: NEGATIVE
SARS Coronavirus 2 by RT PCR: NEGATIVE

## 2020-12-17 LAB — PROTIME-INR
INR: 1.2 (ref 0.8–1.2)
Prothrombin Time: 15.4 seconds — ABNORMAL HIGH (ref 11.4–15.2)

## 2020-12-17 MED ORDER — CEFTRIAXONE SODIUM 2 G IJ SOLR
2.0000 g | INTRAMUSCULAR | Status: AC
  Administered 2020-12-18 – 2020-12-22 (×5): 2 g via INTRAVENOUS
  Filled 2020-12-17 (×5): qty 2

## 2020-12-17 MED ORDER — POLYETHYLENE GLYCOL 3350 17 G PO PACK: 17.0000 g | PACK | Freq: Every day | ORAL | Status: DC | PRN

## 2020-12-17 MED ORDER — SODIUM CHLORIDE 0.9 % IV SOLN
1.0000 g | Freq: Once | INTRAVENOUS | Status: AC
  Administered 2020-12-17: 1 g via INTRAVENOUS
  Filled 2020-12-17: qty 10

## 2020-12-17 MED ORDER — DICLOFENAC SODIUM 1 % EX GEL: 4.0000 g | Freq: Three times a day (TID) | CUTANEOUS | Status: DC | PRN

## 2020-12-17 MED ORDER — ONDANSETRON HCL 4 MG/2ML IJ SOLN: 4.0000 mg | Freq: Four times a day (QID) | INTRAMUSCULAR | Status: DC | PRN

## 2020-12-17 MED ORDER — DONEPEZIL HCL 10 MG PO TABS
10.0000 mg | ORAL_TABLET | Freq: Every day | ORAL | Status: DC
  Administered 2020-12-18 – 2020-12-23 (×6): 10 mg via ORAL
  Filled 2020-12-17 (×6): qty 1

## 2020-12-17 MED ORDER — ONDANSETRON HCL 4 MG PO TABS: 4.0000 mg | ORAL_TABLET | Freq: Four times a day (QID) | ORAL | Status: DC | PRN

## 2020-12-17 MED ORDER — ASPIRIN 81 MG PO CHEW
81.0000 mg | CHEWABLE_TABLET | Freq: Every day | ORAL | Status: DC
  Administered 2020-12-18 – 2020-12-23 (×6): 81 mg via ORAL
  Filled 2020-12-17 (×6): qty 1

## 2020-12-17 MED ORDER — ACETAMINOPHEN 325 MG PO TABS: 650.0000 mg | ORAL_TABLET | Freq: Four times a day (QID) | ORAL | Status: DC | PRN

## 2020-12-17 MED ORDER — ENOXAPARIN SODIUM 30 MG/0.3ML IJ SOSY
30.0000 mg | PREFILLED_SYRINGE | INTRAMUSCULAR | Status: DC
  Administered 2020-12-18 – 2020-12-23 (×6): 30 mg via SUBCUTANEOUS
  Filled 2020-12-17 (×6): qty 0.3

## 2020-12-17 MED ORDER — INSULIN ASPART 100 UNIT/ML IJ SOLN: 0.0000 [IU] | Freq: Three times a day (TID) | INTRAMUSCULAR | Status: DC

## 2020-12-17 MED ORDER — METOPROLOL TARTRATE 5 MG/5ML IV SOLN
5.0000 mg | Freq: Four times a day (QID) | INTRAVENOUS | Status: DC | PRN
Start: 2020-12-17 — End: 2020-12-24

## 2020-12-17 MED ORDER — MEMANTINE HCL 10 MG PO TABS
10.0000 mg | ORAL_TABLET | Freq: Two times a day (BID) | ORAL | Status: DC
  Administered 2020-12-18 – 2020-12-23 (×12): 10 mg via ORAL
  Filled 2020-12-17 (×12): qty 1

## 2020-12-17 MED ORDER — SERTRALINE HCL 25 MG PO TABS
25.0000 mg | ORAL_TABLET | Freq: Every day | ORAL | Status: DC
  Administered 2020-12-18 – 2020-12-23 (×6): 25 mg via ORAL
  Filled 2020-12-17 (×6): qty 1

## 2020-12-17 MED ORDER — SODIUM CHLORIDE 0.9 % IV BOLUS
500.0000 mL | Freq: Once | INTRAVENOUS | Status: AC
  Administered 2020-12-17: 500 mL via INTRAVENOUS

## 2020-12-17 MED ORDER — SODIUM CHLORIDE 0.9 % IV SOLN
500.0000 mg | Freq: Once | INTRAVENOUS | Status: AC
  Administered 2020-12-17: 500 mg via INTRAVENOUS
  Filled 2020-12-17: qty 500

## 2020-12-17 MED ORDER — HYDROCODONE-ACETAMINOPHEN 5-325 MG PO TABS: 1.0000 | ORAL_TABLET | ORAL | Status: DC | PRN

## 2020-12-17 MED ORDER — SODIUM CHLORIDE 0.9% FLUSH
3.0000 mL | Freq: Two times a day (BID) | INTRAVENOUS | Status: DC
  Administered 2020-12-21 – 2020-12-23 (×4): 3 mL via INTRAVENOUS

## 2020-12-17 MED ORDER — SODIUM CHLORIDE 0.9% FLUSH: 3.0000 mL | INTRAVENOUS | Status: DC | PRN

## 2020-12-17 MED ORDER — SODIUM CHLORIDE 0.9 % IV SOLN
250.0000 mL | INTRAVENOUS | Status: DC | PRN
  Administered 2020-12-17 – 2020-12-20 (×2): 250 mL via INTRAVENOUS

## 2020-12-17 MED ORDER — AZITHROMYCIN 500 MG IV SOLR
500.0000 mg | INTRAVENOUS | Status: DC
Start: 2020-12-18 — End: 2020-12-18

## 2020-12-17 NOTE — H&P (Signed)
History and Physical    Jessica Benton [id number] DOB: 06/17/1928 DOA: 12/17/2020  PCP: Bartholome Bill, MD  Patient coming from: [facility]  I have personally briefly reviewed patient's old medical records in [place]  Chief Complaint: Confusion  HPI: Jessica Benton is a 87 y.o. female with medical history significant of vascular dementia who was recently placing hearing screens in the last month or so.  They are waiting to get a bed in the memory care section of that.  Today she just did not seem to be her usual self and she was brought into the emergency room for evaluation. (ED Course: In the ED she appeared febrile at 101.4 she was tachycardic at 110 tachypneic and found to have.  UTI with possible pneumonia.  Review of Systems: As per HPI otherwise 10 point review of systems negative.   Past Medical History:  Diagnosis Date   Cancer [facility])    Cataract    Cerebrovascular disease 06/13/2018   Gait disorder 06/13/2018   Osteoporosis     Past Surgical History:  Procedure Laterality Date   APPENDECTOMY     BREAST SURGERY     EYE SURGERY     PARTIAL HYSTERECTOMY       reports that she has never smoked. She has never used smokeless tobacco. She reports that she does not drink alcohol and does not use drugs.  No Known Allergies  Family History  Problem Relation Age of Onset   Parkinsonism Mother    Dementia Mother    Heart attack Father      Prior to Admission medications   Medication Sig Start Date End Date Taking? Authorizing Provider  acetaminophen (TYLENOL) 325 MG tablet Take 650 mg by mouth every 6 (six) hours as needed for mild pain or moderate pain.   Yes [provider]  aspirin 81 MG chewable tablet Chew 81 mg by mouth daily.   Yes [provider]  diclofenac Sodium (VOLTAREN) 1 % GEL Apply 4 g topically every 8 (eight) hours as needed for pain. Apply to both knees 11/15/20  Yes [provider]  donepezil (ARICEPT)  10 MG tablet Take 1 tablet (10 mg total) by mouth at bedtime. 10/30/20  Yes Suzzanne Cloud, NP  memantine (NAMENDA) 10 MG tablet Take 1 tablet by mouth twice daily Patient taking differently: Take 10 mg by mouth 2 (two) times daily. 10/30/20  Yes Suzzanne Cloud, NP  sertraline (ZOLOFT) 25 MG tablet Take 1 tablet (25 mg total) by mouth daily. 10/30/20  Yes Suzzanne Cloud, NP    Physical Exam:  Constitutional: NAD, calm, comfortable Vitals:   12/17/20 1800 12/17/20 1830 12/17/20 1930 12/17/20 1945  BP: (!) 148/95 (!) 153/96 140/84   Pulse: 100 97 92 91  Resp: (!) 29 (!) 23  (!) 26  Temp:      TempSrc:      SpO2: 96% 96% 94% 97%   Eyes: PERRL, lids and conjunctivae normal ENMT: Mucous membranes are moist. Posterior pharynx clear of any exudate or lesions.Normal dentition.  Neck: normal, supple, no masses, no thyromegaly Respiratory: clear to auscultation bilaterally, no wheezing, no crackles. Normal respiratory effort. No accessory muscle use.  Cardiovascular: Regular rate and rhythm, no murmurs / rubs / gallops. No extremity edema. 2+ pedal pulses. No carotid bruits.  Abdomen: no tenderness, no masses palpated. No hepatosplenomegaly. Bowel sounds positive.  Musculoskeletal: no clubbing / cyanosis. No joint deformity upper and lower extremities. Good ROM,  no contractures. Normal muscle tone.  Skin: no rashes, lesions, ulcers. No induration Neurologic: CN 2-12 grossly intact. Sensation intact, DTR normal. Strength 5/5 in all 4.  Psychiatric: Patient is alert, answers questions appropriately  Labs on Admission: I have personally reviewed following labs and imaging studies  CBC: Recent Labs  Lab 12/17/20 1352  WBC 16.6*  NEUTROABS 13.6*  HGB 13.5  HCT 42.8  MCV 96.2  PLT 0000000   Basic Metabolic Panel: Recent Labs  Lab 12/17/20 1352  NA 140  K 4.1  CL 105  CO2 26  GLUCOSE 150*  BUN 25*  CREATININE 0.98  CALCIUM 8.4*    Liver Function Tests: Recent Labs  Lab 12/17/20 1352   AST 28  ALT 18  ALKPHOS 116  BILITOT 1.0  PROT 6.0*  ALBUMIN 2.7*   Coagulation Profile: Recent Labs  Lab 12/17/20 1352  INR 1.2    Urine analysis:    Component Value Date/Time   COLORURINE YELLOW 12/17/2020 1603   APPEARANCEUR CLOUDY (A) 12/17/2020 1603   LABSPEC 1.011 12/17/2020 1603   PHURINE 9.0 (H) 12/17/2020 1603   GLUCOSEU NEGATIVE 12/17/2020 1603   HGBUR NEGATIVE 12/17/2020 1603   BILIRUBINUR NEGATIVE 12/17/2020 1603   KETONESUR NEGATIVE 12/17/2020 1603   PROTEINUR 30 (A) 12/17/2020 1603   NITRITE POSITIVE (A) 12/17/2020 1603   LEUKOCYTESUR LARGE (A) 12/17/2020 1603    Radiological Exams on Admission: DG Chest Port 1 View  Result Date: 12/17/2020 CLINICAL DATA:  Fever and fatigue. EXAM: PORTABLE CHEST 1 VIEW COMPARISON:  PA and lateral chest 11/28/2020 and 07/21/2006. FINDINGS: The patient has new small to moderate bilateral pleural effusions and basilar airspace disease. There is cardiomegaly. Aortic atherosclerosis. No pneumothorax. No acute or focal bony abnormality. IMPRESSION: New small to moderate bilateral pleural effusions and basilar airspace disease which could be due to atelectasis or pneumonia. Aortic Atherosclerosis (ICD10-I70.0). Electronically Signed   By: Inge Rise M.D.   On: 12/17/2020 14:33    EKG: Independently reviewed.  Sinus tachycardia  Assessment/Plan Principal Problem:   Pyelonephritis Active Problems:   Mixed Alzheimer's and vascular dementia ([place])  Pyelonephritis Probable sepsis based on temp to 101.4 tachypnea 44 tachycardia at 110 Urine is quite dirty consistent with UTI Possible ARDS versus pneumonia. On Rocephin plus azithromycin though may be able to narrow antibiotic coverage once cultures are back.  Mixed Alzheimer's and vascular dementia, end-stage Continue Namenda Zoloft Donepezil Patient's daughter was interested in palliative care consult  DVT prophylaxis: Lovenox SQ Code Status: DNR confirmed with daughter  today Family Communication: Daughter by phone Disposition Plan: SNF Consults called: Palliative care Admission status: Inpatient Patient is admitted to inpatient status due to need for IV antibiotics, IV fluid resuscitation.  Donnamae Jude MD [facility]  If 7PM-7AM, please contact night-coverage 12/17/2020, 9:07 PM

## 2020-12-17 NOTE — ED Triage Notes (Signed)
Pt from assisted living, not eating or drinking well, lethargy x 3 days, fever.

## 2020-12-17 NOTE — ED Provider Notes (Signed)
[place] DEPT Provider Note   CSN: [id number] Arrival date & time: 12/17/20  1248     History Chief Complaint  Patient presents with   Fever   Fatigue   Failure To Thrive    Jessica Benton is a 87 y.o. female.   Fever Level 5 caveat due to some dementia.  Reportedly been more confused the last 3 days.  Found to have fever upon arrival.  Reportedly not been eating and drinking.  Patient is status she is feels little bad.  States she had fever.  No cough.  No nausea or vomiting.  No dysuria.  No abdominal pain.  Does have some swelling in her legs which she says is chronic.  Patient's daughter later comes and states that she thinks she could have a urinary tract infection.    Past Medical History:  Diagnosis Date   Cancer [facility])    Cataract    Cerebrovascular disease 06/13/2018   Gait disorder 06/13/2018   Osteoporosis     Patient Active Problem List   Diagnosis Date Noted   Mixed Alzheimer's and vascular dementia ([place]) 06/13/2018   Gait disorder 06/13/2018    Past Surgical History:  Procedure Laterality Date   APPENDECTOMY     BREAST SURGERY     EYE SURGERY     PARTIAL HYSTERECTOMY       OB History   No obstetric history on file.     Family History  Problem Relation Age of Onset   Parkinsonism Mother    Dementia Mother    Heart attack Father     Social History   Tobacco Use   Smoking status: Never   Smokeless tobacco: Never  Vaping Use   Vaping Use: Never used  Substance Use Topics   Alcohol use: No   Drug use: No    Home Medications Prior to Admission medications   Medication Sig Start Date End Date Taking? Authorizing Provider  acetaminophen (TYLENOL) 325 MG tablet Take 650 mg by mouth every 6 (six) hours as needed for mild pain or moderate pain.   Yes [provider]  aspirin 81 MG chewable tablet Chew 81 mg by mouth daily.   Yes [provider]  diclofenac Sodium (VOLTAREN) 1 % GEL Apply 4 g  topically every 8 (eight) hours as needed for pain. Apply to both knees 11/15/20  Yes [provider]  donepezil (ARICEPT) 10 MG tablet Take 1 tablet (10 mg total) by mouth at bedtime. 10/30/20  Yes Suzzanne Cloud, NP  memantine (NAMENDA) 10 MG tablet Take 1 tablet by mouth twice daily Patient taking differently: Take 10 mg by mouth 2 (two) times daily. 10/30/20  Yes Suzzanne Cloud, NP  sertraline (ZOLOFT) 25 MG tablet Take 1 tablet (25 mg total) by mouth daily. 10/30/20  Yes Suzzanne Cloud, NP    Allergies    Patient has no known allergies.  Review of Systems   Review of Systems  Unable to perform ROS: Dementia  Constitutional:  Positive for fever.   Physical Exam Updated Vital Signs BP (!) 152/93   Pulse (!) 101   Temp (!) 101.4 F (38.6 C) (Rectal)   Resp (!) 26   SpO2 97%   Physical Exam Vitals reviewed.  HENT:     Head: Atraumatic.  Eyes:     Extraocular Movements: Extraocular movements intact.     Pupils: Pupils are equal, round, and reactive to light.  Cardiovascular:  Rate and Rhythm: Regular rhythm.     Heart sounds: Murmur heard.  Pulmonary:     Breath sounds: No wheezing or rhonchi.  Abdominal:     Tenderness: There is no abdominal tenderness.  Musculoskeletal:        General: No tenderness.     Cervical back: Neck supple.     Right lower leg: Edema present.     Left lower leg: Edema present.     Comments: Pitting edema bilateral lower extremities.  Skin:    Capillary Refill: Capillary refill takes less than 2 seconds.  Neurological:     Mental Status: She is alert.     Comments: Awake and pleasant with some confusion.    ED Results / Procedures / Treatments   Labs (all labs ordered are listed, but only abnormal results are displayed) Labs Reviewed  COMPREHENSIVE METABOLIC PANEL - Abnormal; Notable for the following components:      Result Value   Glucose, Bld 150 (*)    BUN 25 (*)    Calcium 8.4 (*)    Total Protein 6.0 (*)    Albumin 2.7  (*)    GFR, Estimated 55 (*)    All other components within normal limits  CBC WITH DIFFERENTIAL/PLATELET - Abnormal; Notable for the following components:   WBC 16.6 (*)    Neutro Abs 13.6 (*)    Monocytes Absolute 1.8 (*)    Abs Immature Granulocytes 0.18 (*)    All other components within normal limits  PROTIME-INR - Abnormal; Notable for the following components:   Prothrombin Time 15.4 (*)    All other components within normal limits  RESP PANEL BY RT-PCR (FLU A&B, COVID) ARPGX2  CULTURE, BLOOD (SINGLE)  URINE CULTURE  LACTIC ACID, PLASMA  APTT  LACTIC ACID, PLASMA  URINALYSIS, ROUTINE W REFLEX MICROSCOPIC    EKG None  Radiology DG Chest Port 1 View  Result Date: 12/17/2020 CLINICAL DATA:  Fever and fatigue. EXAM: PORTABLE CHEST 1 VIEW COMPARISON:  PA and lateral chest 11/28/2020 and 07/21/2006. FINDINGS: The patient has new small to moderate bilateral pleural effusions and basilar airspace disease. There is cardiomegaly. Aortic atherosclerosis. No pneumothorax. No acute or focal bony abnormality. IMPRESSION: New small to moderate bilateral pleural effusions and basilar airspace disease which could be due to atelectasis or pneumonia. Aortic Atherosclerosis (ICD10-I70.0). Electronically Signed   By: Inge Rise M.D.   On: 12/17/2020 14:33    Procedures Procedures   Medications Ordered in ED Medications  cefTRIAXone (ROCEPHIN) 1 g in sodium chloride 0.9 % 100 mL IVPB (has no administration in time range)  azithromycin (ZITHROMAX) 500 mg in sodium chloride 0.9 % 250 mL IVPB (has no administration in time range)  sodium chloride 0.9 % bolus 500 mL (has no administration in time range)    ED Course  I have reviewed the triage vital signs and the nursing notes.  Pertinent labs & imaging results that were available during my care of the patient were reviewed by me and considered in my medical decision making (see chart for details).    MDM Rules/Calculators/A&P                            Patient with fever.  Chest x-ray showed potential pneumonia.  Not hypoxic.  Given Rocephin and azithromycin later added.  Patient's daughter thinks she could have a urinary tract infection which patient has had previously.  Rocephin would also cover potential  UTI.  Normal lactic acid.  Does have a leukocytosis.  Care turned over to Dr. Darl Householder.  Does not have an apparent severe sepsis at this time Final Clinical Impression(s) / ED Diagnoses Final diagnoses:  Fever, unspecified fever cause    Rx / DC Orders ED Discharge Orders     None        Davonna Belling, MD 12/17/20 1546

## 2020-12-18 ENCOUNTER — Inpatient Hospital Stay (HOSPITAL_COMMUNITY): Payer: Medicare PPO

## 2020-12-18 ENCOUNTER — Ambulatory Visit: Payer: Medicare PPO | Admitting: Neurology

## 2020-12-18 DIAGNOSIS — N12 Tubulo-interstitial nephritis, not specified as acute or chronic: Secondary | ICD-10-CM | POA: Diagnosis not present

## 2020-12-18 DIAGNOSIS — G309 Alzheimer's disease, unspecified: Secondary | ICD-10-CM | POA: Diagnosis not present

## 2020-12-18 DIAGNOSIS — N390 Urinary tract infection, site not specified: Secondary | ICD-10-CM

## 2020-12-18 DIAGNOSIS — J189 Pneumonia, unspecified organism: Secondary | ICD-10-CM | POA: Diagnosis not present

## 2020-12-18 DIAGNOSIS — F0280 Dementia in other diseases classified elsewhere without behavioral disturbance: Secondary | ICD-10-CM

## 2020-12-18 DIAGNOSIS — A419 Sepsis, unspecified organism: Principal | ICD-10-CM

## 2020-12-18 DIAGNOSIS — E43 Unspecified severe protein-calorie malnutrition: Secondary | ICD-10-CM

## 2020-12-18 DIAGNOSIS — F0150 Vascular dementia without behavioral disturbance: Secondary | ICD-10-CM

## 2020-12-18 LAB — COMPREHENSIVE METABOLIC PANEL
ALT: 21 U/L (ref 0–44)
AST: 26 U/L (ref 15–41)
Albumin: 2.3 g/dL — ABNORMAL LOW (ref 3.5–5.0)
Alkaline Phosphatase: 100 U/L (ref 38–126)
Anion gap: 9 (ref 5–15)
BUN: 19 mg/dL (ref 8–23)
CO2: 26 mmol/L (ref 22–32)
Calcium: 8.7 mg/dL — ABNORMAL LOW (ref 8.9–10.3)
Chloride: 106 mmol/L (ref 98–111)
Creatinine, Ser: 0.77 mg/dL (ref 0.44–1.00)
GFR, Estimated: >60 mL/min (ref >60)
Glucose, Bld: 118 mg/dL — ABNORMAL HIGH (ref 70–99)
Potassium: 4 mmol/L (ref 3.5–5.1)
Sodium: 141 mmol/L (ref 135–145)
Total Bilirubin: 0.7 mg/dL (ref 0.3–1.2)
Total Protein: 5.2 g/dL — ABNORMAL LOW (ref 6.5–8.1)

## 2020-12-18 LAB — GLUCOSE, CAPILLARY
Glucose-Capillary: 117 mg/dL — ABNORMAL HIGH (ref 70–99)
Glucose-Capillary: 98 mg/dL (ref 70–99)
Glucose-Capillary: 102 mg/dL — ABNORMAL HIGH (ref 70–99)
Glucose-Capillary: 108 mg/dL — ABNORMAL HIGH (ref 70–99)
Glucose-Capillary: 99 mg/dL (ref 70–99)

## 2020-12-18 LAB — APTT: aPTT: 29 seconds (ref 24–36)

## 2020-12-18 LAB — CBC
HCT: 38.6 % (ref 36.0–46.0)
Hemoglobin: 12.1 g/dL (ref 12.0–15.0)
MCH: 30.9 pg (ref 26.0–34.0)
MCHC: 31.3 g/dL (ref 30.0–36.0)
MCV: 98.5 fL (ref 80.0–100.0)
Platelets: 240 10*3/uL (ref 150–400)
RBC: 3.92 MIL/uL (ref 3.87–5.11)
RDW: 13.9 % (ref 11.5–15.5)
WBC: 14.3 10*3/uL — ABNORMAL HIGH (ref 4.0–10.5)
nRBC: 0 % (ref 0.0–0.2)

## 2020-12-18 LAB — MRSA NEXT GEN BY PCR, NASAL: MRSA by PCR Next Gen: NOT DETECTED

## 2020-12-18 LAB — HEMOGLOBIN A1C
Hgb A1c MFr Bld: 5.9 % — ABNORMAL HIGH (ref 4.8–5.6)
Mean Plasma Glucose: 123 mg/dL

## 2020-12-18 LAB — STREP PNEUMONIAE URINARY ANTIGEN: Strep Pneumo Urinary Antigen: NEGATIVE

## 2020-12-18 LAB — PROTIME-INR
INR: 1.1 (ref 0.8–1.2)
Prothrombin Time: 14.6 seconds (ref 11.4–15.2)

## 2020-12-18 MED ORDER — AZITHROMYCIN 250 MG PO TABS
500.0000 mg | ORAL_TABLET | Freq: Every day | ORAL | Status: AC
  Administered 2020-12-18 – 2020-12-21 (×4): 500 mg via ORAL
  Filled 2020-12-18 (×4): qty 2

## 2020-12-18 MED ORDER — ENSURE ENLIVE PO LIQD
237.0000 mL | Freq: Two times a day (BID) | ORAL | Status: DC
  Administered 2020-12-18 – 2020-12-23 (×8): 237 mL via ORAL

## 2020-12-18 MED ORDER — ADULT MULTIVITAMIN W/MINERALS CH
1.0000 | ORAL_TABLET | Freq: Every day | ORAL | Status: DC
  Administered 2020-12-19 – 2020-12-23 (×5): 1 via ORAL
  Filled 2020-12-18 (×5): qty 1

## 2020-12-18 MED ORDER — FUROSEMIDE 10 MG/ML IJ SOLN
40.0000 mg | Freq: Once | INTRAMUSCULAR | Status: AC
  Administered 2020-12-18: 40 mg via INTRAVENOUS
  Filled 2020-12-18: qty 4

## 2020-12-18 NOTE — Plan of Care (Signed)
  Problem: Education: Goal: Knowledge of General Education information will improve Description: Including pain rating scale, medication(s)/side effects and non-pharmacologic comfort measures Outcome: Progressing   Problem: Activity: Goal: Risk for activity intolerance will decrease Outcome: Progressing   Problem: Nutrition: Goal: Adequate nutrition will be maintained Outcome: Progressing   

## 2020-12-18 NOTE — Progress Notes (Signed)
PT Cancellation Note  Patient Details Name: Jessica Benton MRN: [id number] DOB: [birth year]   Cancelled Treatment:    Reason Eval/Treat Not Completed: Fatigue/lethargy limiting ability to participate (pt resting in bed, eyes closed. alerted to auditory stimulus but unable to open eyes and drifting back to sleep. Will follow up at later date/time as schedule allows and pt able to participate.)  Verner Mould, [place] Office [phone] Pager [phone]

## 2020-12-18 NOTE — Progress Notes (Addendum)
PROGRESS NOTE    Jessica Benton  [id number] DOB: 20-Jun-1928 DOA: 12/17/2020 PCP: Bartholome Bill, MD   Brief Narrative:  The patient is a 87 year old elderly Caucasian female with a past medical history significant for but not limited to vascular dementia as well as other comorbidities who was recently placed in [place] the last month or so.  They are currently awaiting bed to get memory care section and per her daughter did not seem to be her usual self so she brought to the emergency room for further evaluation.  In the ED she is found to be tachycardic and tachypneic with a fever of 101.4.  She was given a working diagnosis of sepsis secondary to UTI as well as possible concomitant pneumonia and has been initiated on antibiotics.  She is also noted to be in acute hypoxic respiratory failure secondary to her likely pneumonia.  We will be obtaining SLP evaluation to check for aspiration.  Assessment & Plan:   Principal Problem:   Pyelonephritis Active Problems:   Mixed Alzheimer's and vascular dementia ([place])  Sepsis 2/2 UTI/Pyelonephritis with likely concomitant Right sided pneumonia present on admission -Presented with a fever of 101.4, tachycardia of 111, elevated respiratory rate of 44, and a leukocytosis of 16.6 which is now improved to 14.3 now -Urinalysis done and showed a cloudy appearance with large leukocytes, positive nitrites, urine protein of 30, present amorphous crystals, many bacteria in the urine, 0-5 RBCs per high-power field, and 6-10 WBCs -Urine culture and blood culture still pending -Initial chest x-ray showed "New small to moderate bilateral pleural effusions and basilar airspace disease which could be due to atelectasis or pneumonia. Aortic Atherosclerosis." -Repeat chest x-ray showed "Small bilateral pleural effusions and basilar atelectasis are greater on the right and decreased since the prior examination. Cardiomegaly. No pneumothorax. Aortic  atherosclerosis." -Patient was wearing supplemental oxygen via nasal cannula and does not wear oxygen at home -Continue with antibiotic coverage with IV ceftriaxone and azithromycin -Lactic acid levels 1.7 now trended down to 1.2 next-continue to monitor temperature curve as well as WBC -Check SLP evaluation to evaluate for aspiration -Repeat CXR in the AM   Acute Respiratory Failure with Hypoxia -In the setting of Above with ? Pneumonia  -SpO2: 100 % O2 Flow Rate (L/min): 4 L/min -Will try a dose of IV Lasix 40 mg x1 -Continuous Pulse Oximetry and Maintain O2 Saturations >90% -Continue Supplemental O2 via [place] and Wean to Room Air -Repeat CXR in the AM   Severe malnutrition in the context of chronic illness -Nutritionist consulted for further evaluation and diet will be liberalized from a heart healthy carb modified to 2 g sodium diet  -Estimated body mass index is 18.55 kg/m as calculated from the following:   Height as of this encounter: '5\' 3"'$  (1.6 m).   Weight as of this encounter: 47.5 kg. -Nutritionist is recommending Ensure Enlive twice daily as well as 30 mL of Prosource plus twice daily and 1 tab with multivitamin with minerals daily  Mixed Alzheimer's and Vascular Dementia, End-Stage Acute Confusion Superimposed on Chronic Dementia  -C/w donepezil 10 mg p.o. nightly, memantine 10 mg p.o. twice daily, sertraline 25 mg p.o. daily -Palliative care consulted for further goals of care discussion  Hypoalbuminemia -Has Bilateral LE Swelling likely from 3rd Spacing -CXR showed Some small Bilateral Effusions -Will give a Dose of IV Lasix as above -Albumin Level was 2.3 -Continue to Monitor and may obtain a Pre-Albumin Level  DVT prophylaxis: Enoxaparin  30 mg subcu every 24 Code Status: FULL CODE  Family Communication: No family present at bedside  Disposition Plan:   Status is: Inpatient  Remains inpatient appropriate because:Unsafe d/c plan, IV treatments appropriate due to  intensity of illness or inability to take PO, and Inpatient level of care appropriate due to severity of illness  Dispo: The patient is from: Home              Anticipated d/c is to: SNF              Patient currently is not medically stable to d/c.   Difficult to place patient No  Consultants:  Palliative Care Medicine   Procedures: None  Antimicrobials:  Anti-infectives (From admission, onward)    Start     Dose/Rate Route Frequency Ordered Stop   12/18/20 1600  cefTRIAXone (ROCEPHIN) 2 g in sodium chloride 0.9 % 100 mL IVPB        2 g 200 mL/hr over 30 Minutes Intravenous Every 24 hours 12/17/20 2306 12/23/20 1559   12/18/20 1600  azithromycin (ZITHROMAX) 500 mg in sodium chloride 0.9 % 250 mL IVPB        500 mg 250 mL/hr over 60 Minutes Intravenous Every 24 hours 12/17/20 2306 12/23/20 1559   12/17/20 1545  azithromycin (ZITHROMAX) 500 mg in sodium chloride 0.9 % 250 mL IVPB        500 mg 250 mL/hr over 60 Minutes Intravenous  Once 12/17/20 1534 12/17/20 1759   12/17/20 1515  cefTRIAXone (ROCEPHIN) 1 g in sodium chloride 0.9 % 100 mL IVPB        1 g 200 mL/hr over 30 Minutes Intravenous  Once 12/17/20 1505 12/17/20 1643        Subjective: Examined at bedside and she is resting and she is extremely hard of hearing.  Does not wear oxygen at home.  Denies any pain.  Remains a little confused secondary to her dementia.  No other concerns or complaints at this time.  Objective: Vitals:   12/17/20 2308 12/17/20 2324 12/18/20 0341 12/18/20 0638  BP: (!) 160/96  (!) 167/98 (!) 161/87  Pulse: (!) 109  100 95  Resp: '20  20 18  '$ Temp: 98.8 F (37.1 C)  98.6 F (37 C) 98.4 F (36.9 C)  TempSrc:      SpO2: 92%  99% 100%  Weight:  47.5 kg    Height:  '5\' 3"'$  (1.6 m)      Intake/Output Summary (Last 24 hours) at 12/18/2020 0831 Last data filed at 12/18/2020 [id number] Gross per 24 hour  Intake 121.02 ml  Output 150 ml  Net -28.98 ml   Filed Weights   12/17/20 2324  Weight: 47.5  kg   Examination: Physical Exam:  Constitutional: Thin chronically ill-appearing elderly Caucasian female Eyes: Lids and conjunctivae normal, sclerae anicteric  ENMT: External Ears, Nose appear normal. Hard of hearing  Neck: Appears normal, supple, no cervical masses, normal ROM, no appreciable thyromegaly; no appreciable JVD Respiratory: Diminished to auscultation bilaterally with coarse breath sounds, no wheezing, rales, rhonchi or crackles. Normal respiratory effort and patient is not tachypenic. No accessory muscle use.  Cardiovascular: RRR, no murmurs / rubs / gallops. S1 and S2 auscultated. 1-2+ LE pitting edema  Abdomen: Soft, non-tender, non-distended.  Bowel sounds positive.  GU: Deferred. Musculoskeletal: No clubbing / cyanosis of digits/nails. No joint deformity upper and lower extremities.  Skin: No rashes, lesions, ulcers on a limited skin evaluation. No induration; Warm and  dry.  Neurologic: CN 2-12 grossly intact with no focal deficits. Romberg sign and cerebellar reflexes not assessed.  Psychiatric: Impaired judgment and insight. A little Somnolent and Drowsy. Normal mood and appropriate affect.   Data Reviewed: I have personally reviewed following labs and imaging studies  CBC: Recent Labs  Lab 12/17/20 1352 12/18/20 0459  WBC 16.6* 14.3*  NEUTROABS 13.6*  --   HGB 13.5 12.1  HCT 42.8 38.6  MCV 96.2 98.5  PLT 278 [id number]   Basic Metabolic Panel: Recent Labs  Lab 12/17/20 1352 12/18/20 0459  NA 140 141  K 4.1 4.0  CL 105 106  CO2 26 26  GLUCOSE 150* 118*  BUN 25* 19  CREATININE 0.98 0.77  CALCIUM 8.4* 8.7*   GFR: Estimated Creatinine Clearance: 35 mL/min (by C-G formula based on SCr of 0.77 mg/dL). Liver Function Tests: Recent Labs  Lab 12/17/20 1352 12/18/20 0459  AST 28 26  ALT 18 21  ALKPHOS 116 100  BILITOT 1.0 0.7  PROT 6.0* 5.2*  ALBUMIN 2.7* 2.3*   No results for input(s): LIPASE, AMYLASE in the last 168 hours. No results for input(s):  AMMONIA in the last 168 hours. Coagulation Profile: Recent Labs  Lab 12/17/20 1352 12/17/20 2328  INR 1.2 1.1   Cardiac Enzymes: No results for input(s): CKTOTAL, CKMB, CKMBINDEX, TROPONINI in the last 168 hours. BNP (last 3 results) No results for input(s): PROBNP in the last 8760 hours. HbA1C: No results for input(s): HGBA1C in the last 72 hours. CBG: Recent Labs  Lab 12/18/20 0639 12/18/20 0732  GLUCAP 117* 98   Lipid Profile: No results for input(s): CHOL, HDL, LDLCALC, TRIG, CHOLHDL, LDLDIRECT in the last 72 hours. Thyroid Function Tests: No results for input(s): TSH, T4TOTAL, FREET4, T3FREE, THYROIDAB in the last 72 hours. Anemia Panel: No results for input(s): VITAMINB12, FOLATE, FERRITIN, TIBC, IRON, RETICCTPCT in the last 72 hours. Sepsis Labs: Recent Labs  Lab 12/17/20 1352 12/17/20 2311  LATICACIDVEN 1.7 1.2    Recent Results (from the past 240 hour(s))  Resp Panel by RT-PCR (Flu A&B, Covid) Nasopharyngeal Swab     Status: None   Collection Time: 12/17/20  1:55 PM   Specimen: Nasopharyngeal Swab; Nasopharyngeal(NP) swabs in vial transport medium  Result Value Ref Range Status   SARS Coronavirus 2 by RT PCR NEGATIVE NEGATIVE Final    Comment: (NOTE) SARS-CoV-2 target nucleic acids are NOT DETECTED.  The SARS-CoV-2 RNA is generally detectable in upper respiratory specimens during the acute phase of infection. The lowest concentration of SARS-CoV-2 viral copies this assay can detect is 138 copies/mL. A negative result does not preclude SARS-Cov-2 infection and should not be used as the sole basis for treatment or other patient management decisions. A negative result may occur with  improper specimen collection/handling, submission of specimen other than nasopharyngeal swab, presence of viral mutation(s) within the areas targeted by this assay, and inadequate number of viral copies(<138 copies/mL). A negative result must be combined with clinical  observations, patient history, and epidemiological information. The expected result is Negative.  Fact Sheet for Patients:  EntrepreneurPulse.com.au  Fact Sheet for Healthcare Providers:  IncredibleEmployment.be  This test is no t yet approved or cleared by the [place] FDA and  has been authorized for detection and/or diagnosis of SARS-CoV-2 by FDA under an Emergency Use Authorization (EUA). This EUA will remain  in effect (meaning this test can be used) for the duration of the COVID-19 declaration under Section 564(b)(1) of the Act, 21  U.S.C.section 360bbb-3(b)(1), unless the authorization is terminated  or revoked sooner.       Influenza A by PCR NEGATIVE NEGATIVE Final   Influenza B by PCR NEGATIVE NEGATIVE Final    Comment: (NOTE) The Xpert Xpress SARS-CoV-2/FLU/RSV plus assay is intended as an aid in the diagnosis of influenza from Nasopharyngeal swab specimens and should not be used as a sole basis for treatment. Nasal washings and aspirates are unacceptable for Xpert Xpress SARS-CoV-2/FLU/RSV testing.  Fact Sheet for Patients: EntrepreneurPulse.com.au  Fact Sheet for Healthcare Providers: IncredibleEmployment.be  This test is not yet approved or cleared by the [place] FDA and has been authorized for detection and/or diagnosis of SARS-CoV-2 by FDA under an Emergency Use Authorization (EUA). This EUA will remain in effect (meaning this test can be used) for the duration of the COVID-19 declaration under Section 564(b)(1) of the Act, 21 U.S.C. section 360bbb-3(b)(1), unless the authorization is terminated or revoked.  Performed at [facility], [place] [address], [place], [place] [address]      RN Pressure Injury Documentation:     Estimated body mass index is 18.55 kg/m as calculated from the following:   Height as of this encounter: '5\' 3"'$  (1.6 m).   Weight as of  this encounter: 47.5 kg.  Malnutrition Type:   Malnutrition Characteristics:   Nutrition Interventions:    Radiology Studies: DG Chest Port 1 View  Result Date: 12/17/2020 CLINICAL DATA:  Fever and fatigue. EXAM: PORTABLE CHEST 1 VIEW COMPARISON:  PA and lateral chest 11/28/2020 and 07/21/2006. FINDINGS: The patient has new small to moderate bilateral pleural effusions and basilar airspace disease. There is cardiomegaly. Aortic atherosclerosis. No pneumothorax. No acute or focal bony abnormality. IMPRESSION: New small to moderate bilateral pleural effusions and basilar airspace disease which could be due to atelectasis or pneumonia. Aortic Atherosclerosis (ICD10-I70.0). Electronically Signed   By: Inge Rise M.D.   On: 12/17/2020 14:33     Scheduled Meds:  aspirin  81 mg Oral Daily   donepezil  10 mg Oral QHS   enoxaparin (LOVENOX) injection  30 mg Subcutaneous Q24H   insulin aspart  0-9 Units Subcutaneous TID WC   memantine  10 mg Oral BID   sertraline  25 mg Oral Daily   sodium chloride flush  3 mL Intravenous Q12H   Continuous Infusions:  sodium chloride 250 mL (12/17/20 2352)   azithromycin     cefTRIAXone (ROCEPHIN)  IV      LOS: 1 day   Kerney Elbe, DO [facility] PAGER is on AMION  If 7PM-7AM, please contact night-coverage [email or web address]

## 2020-12-18 NOTE — Plan of Care (Signed)
  Problem: Clinical Measurements: Goal: Will remain free from infection Outcome: Progressing Goal: Respiratory complications will improve Outcome: Progressing   Problem: Safety: Goal: Ability to remain free from injury will improve Outcome: Progressing   Problem: Education: Goal: Knowledge of General Education information will improve Description: Including pain rating scale, medication(s)/side effects and non-pharmacologic comfort measures Outcome: Not Progressing

## 2020-12-18 NOTE — TOC Initial Note (Signed)
Transition of Care [facility]) - Initial/Assessment Note    Patient Details  Name: Jessica Benton MRN: [id number] Date of Birth: 1928/09/03  Transition of Care [facility]) CM/SW Contact:    Dessa Phi, RN Phone Number: 12/18/2020, 12:26 PM  Clinical Narrative:Hx: advanced dementia. Confirmed from [facility]-ALF-rep Imane following for return-spoke to dtr Holly-states patient has declined-noted discrepancy in vaccine listed-Holly will bring in covid vaccine card to [employer] x1-nsg can make copy & send for correct immunization listed in epic. PT cons-await recc.                  Expected Discharge Plan: Assisted Living Barriers to Discharge: Continued Medical Work up   Patient Goals and CMS Choice Patient states their goals for this hospitalization and ongoing recovery are:: return back to [facility] CMS Medicare.gov Compare Post Acute Care list provided to:: Patient Represenative (must comment) [facility] dtr [phone]) Choice offered to / list presented to : Adult Children  Expected Discharge Plan and Services Expected Discharge Plan: Assisted Living   Discharge Planning Services: CM Consult Post Acute Care Choice: Home Health (ALF) Living arrangements for the past 2 months: [place]                                      Prior Living Arrangements/Services Living arrangements for the past 2 months: [place] Lives with:: Facility Resident Patient language and need for interpreter reviewed:: Yes Do you feel safe going back to the place where you live?: Yes      Need for Family Participation in Patient Care: No (Comment) Care giver support system in place?: Yes (comment) Current home services: DME (rw) Criminal Activity/Legal Involvement Pertinent to Current Situation/Hospitalization: No - Comment as needed  Activities of Daily Living Home Assistive Devices/Equipment: Walker  (specify type) (per pt) ADL Screening (condition at time of admission) Patient's cognitive ability adequate to safely complete daily activities?: No Is the patient deaf or have difficulty hearing?: Yes Does the patient have difficulty seeing, even when wearing glasses/contacts?: No Does the patient have difficulty concentrating, remembering, or making decisions?: Yes Patient able to express need for assistance with ADLs?: Yes Does the patient have difficulty dressing or bathing?: No Independently performs ADLs?: No Communication: Independent Dressing (OT): Needs assistance Is this a change from baseline?: Pre-admission baseline Grooming: Independent Feeding: Independent Bathing: Needs assistance Is this a change from baseline?: Pre-admission baseline Toileting: Needs assistance Is this a change from baseline?: Pre-admission baseline In/Out Bed: Needs assistance Is this a change from baseline?: Pre-admission baseline Walks in Home: Needs assistance Is this a change from baseline?: Pre-admission baseline Does the patient have difficulty walking or climbing stairs?: Yes Weakness of Legs: Both Weakness of Arms/Hands: Both  Permission Sought/Granted Permission sought to share information with : Case Manager Permission granted to share information with : Yes, Verbal Permission Granted  Share Information with NAME: Case manager     Permission granted to share info w Relationship: Holly dtr [phone]     Emotional Assessment Appearance:: Appears stated age Attitude/Demeanor/Rapport: Gracious Affect (typically observed): Accepting Orientation: : Oriented to Self Alcohol / Substance Use: Not Applicable Psych Involvement: No (comment)  Admission diagnosis:  Pyelonephritis [N12] Fever, unspecified fever cause [R50.9] Patient Active Problem List   Diagnosis Date Noted   Pyelonephritis 12/17/2020   Mixed Alzheimer's and vascular dementia ([place]) 06/13/2018  Gait disorder  06/13/2018   PCP:  Bartholome Bill, MD Pharmacy:   [facility], [place] - [address] [place] [address] Phone: [phone] Fax: [phone]  [facility] - [address] [place], [place] - [address] [place] [address] Phone: [phone] Fax: [phone]     Social Determinants of Health (SDOH) Interventions    Readmission Risk Interventions No flowsheet data found.

## 2020-12-18 NOTE — Progress Notes (Signed)
Initial Nutrition Assessment  DOCUMENTATION CODES:   Severe malnutrition in context of chronic illness  INTERVENTION:  - diet liberalization from Heart Healthy/Carb Modified to 2 gram Na. - will order Ensure Enlive BID, each supplement provides 350 kcal and 20 grams of protein. - will order 30 ml Prosource Plus BID, each supplement provides 100 kcal and 15 grams protein.  - will order 1 tablet multivitamin with minerals/day.    NUTRITION DIAGNOSIS:   Severe Malnutrition related to chronic illness (dementia) as evidenced by moderate fat depletion, moderate muscle depletion, severe fat depletion, severe muscle depletion.  GOAL:   Patient will meet greater than or equal to 90% of their needs  MONITOR:   PO intake, Supplement acceptance, Labs, Weight trends  REASON FOR ASSESSMENT:   Malnutrition Screening Tool  ASSESSMENT:   87 y.o. female with medical history of vascular dementia, gait disorder, osteoporosis, and cancer. PTA she was pending placement in a memory care unit. She presented to the ED as staff at her current facility fest she did not seem like herself.  No intakes documented since admission. Patient noted to be a/o to self only. She was sleeping soundly, snoring at time of visit with no family or visitors present. She did not awake to name call or during NFPE.   Spoke with tech who reported patient did not awake during finger stick and that she has been sleeping through entirety of shift. She did not have breakfast.   Weight yesterday was 105 lb and weight on 10/30/20 was 109 lb. This indicates 4 lb weight loss (3.7% body weight) in the past 1.5 months; not significant for time frame.   Per notes: - pyelonephritis - end-stage Alzheimer's and vascular dementia   Labs reviewed; CBGs: 98 and 102 mg/dl, Ca: 8.7 mg/dl. Medications reviewed; sliding scale novolog, 1 tablet multivitamin with minerals/day.    NUTRITION - FOCUSED PHYSICAL EXAM:  Flowsheet Row Most  Recent Value  Orbital Region Unable to assess  Upper Arm Region Severe depletion  Thoracic and Lumbar Region Unable to assess  Buccal Region Moderate depletion  Temple Region Moderate depletion  Clavicle Bone Region Severe depletion  Clavicle and Acromion Bone Region Severe depletion  Scapular Bone Region Unable to assess  Dorsal Hand Severe depletion  Patellar Region Severe depletion  Anterior Thigh Region Mild depletion  Posterior Calf Region Unable to assess  Edema (RD Assessment) Severe  [BLE up to mid-calf]  Hair Reviewed  Eyes Unable to assess  Mouth Unable to assess  Skin Reviewed  Nails Reviewed       Diet Order:   Diet Order             Diet 2 gram sodium Room service appropriate? Yes; Fluid consistency: Thin  Diet effective now                   EDUCATION NEEDS:   No education needs have been identified at this time  Skin:  Skin Assessment: Reviewed RN Assessment  Last BM:  PTA/unknown  Height:   Ht Readings from Last 1 Encounters:  12/17/20 '5\' 3"'$  (1.6 m)    Weight:   Wt Readings from Last 1 Encounters:  12/17/20 47.5 kg     Estimated Nutritional Needs:  Kcal:  1200-1400 kcal Protein:  55-65 grams Fluid:  >/= 1.2 L/day     Jarome Matin, MS, RD, LDN, CNSC Inpatient Clinical Dietitian RD pager # available in AMION  After hours/weekend pager # available in [facility]

## 2020-12-19 ENCOUNTER — Inpatient Hospital Stay (HOSPITAL_COMMUNITY): Payer: Medicare PPO

## 2020-12-19 DIAGNOSIS — J189 Pneumonia, unspecified organism: Secondary | ICD-10-CM | POA: Diagnosis not present

## 2020-12-19 DIAGNOSIS — E43 Unspecified severe protein-calorie malnutrition: Secondary | ICD-10-CM | POA: Diagnosis not present

## 2020-12-19 DIAGNOSIS — G309 Alzheimer's disease, unspecified: Secondary | ICD-10-CM | POA: Diagnosis not present

## 2020-12-19 DIAGNOSIS — N12 Tubulo-interstitial nephritis, not specified as acute or chronic: Secondary | ICD-10-CM | POA: Diagnosis not present

## 2020-12-19 LAB — CBC WITH DIFFERENTIAL/PLATELET
Abs Immature Granulocytes: 0.16 10*3/uL — ABNORMAL HIGH (ref 0.00–0.07)
Basophils Absolute: 0.1 10*3/uL (ref 0.0–0.1)
Basophils Relative: 0 %
Eosinophils Absolute: 0.2 10*3/uL (ref 0.0–0.5)
Eosinophils Relative: 1 %
HCT: 41.5 % (ref 36.0–46.0)
Hemoglobin: 12.9 g/dL (ref 12.0–15.0)
Immature Granulocytes: 1 %
Lymphocytes Relative: 11 %
Lymphs Abs: 1.4 10*3/uL (ref 0.7–4.0)
MCH: 30.1 pg (ref 26.0–34.0)
MCHC: 31.1 g/dL (ref 30.0–36.0)
MCV: 96.7 fL (ref 80.0–100.0)
Monocytes Absolute: 1.3 10*3/uL — ABNORMAL HIGH (ref 0.1–1.0)
Monocytes Relative: 10 %
Neutro Abs: 10 10*3/uL — ABNORMAL HIGH (ref 1.7–7.7)
Neutrophils Relative %: 77 %
Platelets: 285 10*3/uL (ref 150–400)
RBC: 4.29 MIL/uL (ref 3.87–5.11)
RDW: 13.9 % (ref 11.5–15.5)
WBC: 13 10*3/uL — ABNORMAL HIGH (ref 4.0–10.5)
nRBC: 0 % (ref 0.0–0.2)

## 2020-12-19 LAB — COMPREHENSIVE METABOLIC PANEL
ALT: 22 U/L (ref 0–44)
AST: 26 U/L (ref 15–41)
Albumin: 2.6 g/dL — ABNORMAL LOW (ref 3.5–5.0)
Alkaline Phosphatase: 116 U/L (ref 38–126)
Anion gap: 13 (ref 5–15)
BUN: 25 mg/dL — ABNORMAL HIGH (ref 8–23)
CO2: 30 mmol/L (ref 22–32)
Calcium: 8.8 mg/dL — ABNORMAL LOW (ref 8.9–10.3)
Chloride: 100 mmol/L (ref 98–111)
Creatinine, Ser: 0.99 mg/dL (ref 0.44–1.00)
GFR, Estimated: 54 mL/min — ABNORMAL LOW (ref >60)
Glucose, Bld: 110 mg/dL — ABNORMAL HIGH (ref 70–99)
Potassium: 3.8 mmol/L (ref 3.5–5.1)
Sodium: 143 mmol/L (ref 135–145)
Total Bilirubin: 0.7 mg/dL (ref 0.3–1.2)
Total Protein: 6 g/dL — ABNORMAL LOW (ref 6.5–8.1)

## 2020-12-19 LAB — GLUCOSE, CAPILLARY
Glucose-Capillary: 114 mg/dL — ABNORMAL HIGH (ref 70–99)
Glucose-Capillary: 97 mg/dL (ref 70–99)
Glucose-Capillary: 105 mg/dL — ABNORMAL HIGH (ref 70–99)
Glucose-Capillary: 102 mg/dL — ABNORMAL HIGH (ref 70–99)

## 2020-12-19 LAB — LEGIONELLA PNEUMOPHILA SEROGP 1 UR AG: L. pneumophila Serogp 1 Ur Ag: NEGATIVE

## 2020-12-19 LAB — PHOSPHORUS: Phosphorus: 4.4 mg/dL (ref 2.5–4.6)

## 2020-12-19 LAB — MAGNESIUM: Magnesium: 2.3 mg/dL (ref 1.7–2.4)

## 2020-12-19 NOTE — Evaluation (Signed)
Physical Therapy Evaluation Patient Details Name: Jessica Benton MRN: [id number] DOB: [birth year] Today's Date: 12/19/2020   History of Present Illness  87 year old elderly Caucasian female admitted 12/17/20 for Sepsis 2/2 UTI/Pyelonephritis with likely concomitant Right sided pneumonia present on admission.  Pt with a past medical history significant for but not limited to vascular dementia as well as other comorbidities who was recently placed in [place] the last month or so.  They are currently awaiting bed to get memory care section.  Clinical Impression  Pt admitted with above diagnosis. Pt currently with functional limitations due to the deficits listed below (see PT Problem List). Pt will benefit from skilled PT to increase their independence and safety with mobility to allow discharge to the venue listed below.   Pt initially agreeable to mobilize however did not assist with bed mobility.  Pt repositioned in supine and became a little agitated with attempts to assist to comfortable position (pt wanted to be left alone).  Pt also removed O2 [place] and SPO2 93% on room air.  RN notified.  Pt with hx of dementia and presents from ALF.  Uncertain if ALF can provide current level of assist at this time so will recommend SNF.     Follow Up Recommendations SNF    Equipment Recommendations  Wheelchair cushion (measurements PT);Wheelchair (measurements PT)    Recommendations for Other Services       Precautions / Restrictions Precautions Precautions: Fall      Mobility  Bed Mobility Overal bed mobility: Needs Assistance Bed Mobility: Supine to Sit;Sit to Supine     Supine to sit: Total assist Sit to supine: Total assist   General bed mobility comments: utilized bed pads for positioning, pt initially agreeable to attempt OOB however did not assist with movement and required total assist    Transfers                    Ambulation/Gait                Stairs             Wheelchair Mobility    Modified Rankin (Stroke Patients Only)       Balance Overall balance assessment: Needs assistance Sitting-balance support: Feet supported;No upper extremity supported Sitting balance-Leahy Scale: Zero Sitting balance - Comments: pt not assisting                                     Pertinent Vitals/Pain Pain Assessment: Faces Faces Pain Scale: No hurt Pain Intervention(s): Monitored during session;Repositioned    Home Living Family/patient expects to be discharged to:: Assisted living                      Prior Function           Comments: pt reports she ambulates without assistive device however poor historian with hx of dementia     Hand Dominance        Extremity/Trunk Assessment   Upper Extremity Assessment Upper Extremity Assessment: Generalized weakness;Difficult to assess due to impaired cognition (pt not following commands for even grip)    Lower Extremity Assessment Lower Extremity Assessment: Generalized weakness;Difficult to assess due to impaired cognition    Cervical / Trunk Assessment Cervical / Trunk Assessment: Kyphotic;Other exceptions Cervical / Trunk Exceptions: forward head posture  Communication   Communication: No difficulties  Cognition  Arousal/Alertness: Lethargic Behavior During Therapy: WFL for tasks assessed/performed Overall Cognitive Status: No family/caregiver present to determine baseline cognitive functioning                                 General Comments: pt awake with stimulation however quickly falls asleep; hx of dementia, not orientated to place or situation      General Comments      Exercises     Assessment/Plan    PT Assessment Patient needs continued PT services  PT Problem List Decreased strength;Decreased mobility;Decreased activity tolerance;Decreased balance;Decreased knowledge of use of DME;Decreased skin integrity       PT  Treatment Interventions DME instruction;Therapeutic exercise;Stair training;Functional mobility training;Therapeutic activities;Patient/family education;Balance training;Gait training;Wheelchair mobility training    PT Goals (Current goals can be found in the Care Plan section)  Acute Rehab PT Goals PT Goal Formulation: Patient unable to participate in goal setting Time For Goal Achievement: 01/02/21 Potential to Achieve Goals: Good    Frequency Min 2X/week   Barriers to discharge        Co-evaluation               AM-PAC PT "6 Clicks" Mobility  Outcome Measure Help needed turning from your back to your side while in a flat bed without using bedrails?: Total Help needed moving from lying on your back to sitting on the side of a flat bed without using bedrails?: Total Help needed moving to and from a bed to a chair (including a wheelchair)?: Total Help needed standing up from a chair using your arms (e.g., wheelchair or bedside chair)?: Total Help needed to walk in hospital room?: Total Help needed climbing 3-5 steps with a railing? : Total 6 Click Score: 6    End of Session   Activity Tolerance: Patient limited by fatigue Patient left: in bed;with call bell/phone within reach;with bed alarm set Nurse Communication: Mobility status PT Visit Diagnosis: Adult, failure to thrive (R62.7)    Time: [id number] PT Time Calculation (min) (ACUTE ONLY): 11 min   Charges:   PT Evaluation $PT Eval Low Complexity: 1 Low        Kati PT, DPT Acute Rehabilitation Services Pager: [phone] Office: [phone]   Trena Platt 12/19/2020, 12:39 PM

## 2020-12-19 NOTE — Plan of Care (Signed)
  Problem: Clinical Measurements: Goal: Will remain free from infection Outcome: Progressing Goal: Diagnostic test results will improve Outcome: Progressing Goal: Respiratory complications will improve Outcome: Progressing   Problem: Health Behavior/Discharge Planning: Goal: Ability to manage health-related needs will improve Outcome: Not Progressing   Problem: Activity: Goal: Risk for activity intolerance will decrease Outcome: Not Progressing   Problem: Nutrition: Goal: Adequate nutrition will be maintained Outcome: Not Progressing

## 2020-12-19 NOTE — Care Management (Signed)
Transition of Care (TOC) -30 day Note       Patient Details   Name: Jessica Benton  [id number]  Date of Birth:1928-08-19     Transition of Care [facility]) CM/SW Contact   Name:[name]  Phone Number:[phone]  Date:12/19/2020  Time:2:41p     MUST [id number]     To Whom it May Concern:     Please be advised that the above patient will require a short-term nursing home stay, anticipated 30 days or less rehabilitation and strengthening. The plan is for return home.

## 2020-12-19 NOTE — TOC Progression Note (Signed)
Transition of Care [facility]) - Progression Note    Patient Details  Name: Jessica Benton MRN: [id number] Date of Birth: 1928/06/28  Transition of Care [facility]) CM/SW Contact  Jaedin Trumbo, Juliann Pulse, RN Phone Number: 12/19/2020, 2:44 PM  Clinical Narrative: PT recc SNF-await bed offers, & pasrr-spoke to dtr [facility] about SNF recc-agree-she states her Dad is at [facility] currently & she would like them both together-informed her once I have bed offers & they aregiven to her she can ask her specific questions to the reps @ the SNF-[place] voiced understanding. Will need auth once SNF bed chosen, & covid test within 48hrs of d/c.      Expected Discharge Plan: [place] Barriers to Discharge: Continued Medical Work up  Expected Discharge Plan and Services Expected Discharge Plan: [place]   Discharge Planning Services: CM Consult Post Acute Care Choice: Home Health (ALF) Living arrangements for the past 2 months: [place]                                       Social Determinants of Health (SDOH) Interventions    Readmission Risk Interventions No flowsheet data found.

## 2020-12-19 NOTE — Progress Notes (Signed)
PROGRESS NOTE    Jessica Benton  [id number] DOB: 08/10/1928 DOA: 12/17/2020 PCP: Bartholome Bill, MD   Brief Narrative:  The patient is a 87 year old elderly Caucasian female with a past medical history significant for but not limited to vascular dementia as well as other comorbidities who was recently placed in [place] the last month or so.  They are currently awaiting bed to get memory care section and per her daughter did not seem to be her usual self so she brought to the emergency room for further evaluation.  In the ED she is found to be tachycardic and tachypneic with a fever of 101.4.  She was given a working diagnosis of sepsis secondary to UTI as well as possible concomitant pneumonia and has been initiated on antibiotics.  She is also noted to be in acute hypoxic respiratory failure secondary to her likely pneumonia.  We will be obtaining SLP evaluation to check for aspiration and this is still pending. Repeat CXR this AM showed ". Progressive right base infiltrate consistent with pneumonia. Right pleural effusion again noted. Cardiomegaly again.  No pulmonary venous congestion."  Assessment & Plan:   Principal Problem:   Pyelonephritis Active Problems:   Mixed Alzheimer's and vascular dementia ([place])   Protein-calorie malnutrition, severe  Severe Sepsis 2/2 UTI/Pyelonephritis with concomitant Right sided pneumonia present on admission -Presented with a fever of 101.4, tachycardia of 111, elevated respiratory rate of 44, and a leukocytosis of 16.6 which is now improved to 13.0 now and some fatigue, lethargy, and confusion -Urinalysis done and showed a cloudy appearance with large leukocytes, positive nitrites, urine protein of 30, present amorphous crystals, many bacteria in the urine, 0-5 RBCs per high-power field, and 6-10 WBCs -Urine culture showing >100000 of Proteus Mirabilis and 40,000 of E Coli with Sensitivities Pending; -Blood culture x2 still pending but  showing NGTD at 1 Day  -Initial chest x-ray showed "New small to moderate bilateral pleural effusions and basilar airspace disease which could be due to atelectasis or pneumonia. Aortic Atherosclerosis." -Repeat chest x-ray this AM showed ". Progressive right base infiltrate consistent with pneumonia. Right pleural effusion again noted. Cardiomegaly again.  No pulmonary venous congestion." -Patient was wearing supplemental oxygen via nasal cannula yesterday and does not wear oxygen at home; She was placed on 4 Liters but was saturating 100%; Unclear if she was actually hypoxic given no documented SpO2 <88; Now off of Supplemental O2 via [place]  -Continue with antibiotic coverage with IV ceftriaxone and azithromycin and will continue  -Lactic acid levels 1.7 now trended down to 1.2  -Continue to monitor temperature curve as well as WBC -Check SLP evaluation to evaluate for aspiration and this is pending  -Repeat CXR in the AM  -PT OT recommending SNF   ? Acute Respiratory Failure with Hypoxia -In the setting of Above with Pneumonia  -SpO2: 99 % O2 Flow Rate (L/min): 4 L/min; Now off of Supplemental O2 and improved.  -Given adose of IV Lasix 40 mg x1 yesterday -Continuous Pulse Oximetry and Maintain O2 Saturations >90% -Continue Supplemental O2 via [place] and [place] to Room Air -Repeat CXR in the AM and will need an Ambulatory Home O2 Screen  Severe malnutrition in the context of chronic illness -Nutritionist consulted for further evaluation and diet will be liberalized from a heart healthy carb modified to 2 g sodium diet  -Estimated body mass index is 18.55 kg/m as calculated from the following:   Height as of this encounter: 5'  3" (1.6 m).   Weight as of this encounter: 47.5 kg. -Nutritionist is recommending Ensure Enlive twice daily as well as 30 mL of Prosource plus twice daily and 1 tab with multivitamin with minerals daily  Mixed Alzheimer's and Vascular Dementia, End-Stage Acute Confusion  Superimposed on Chronic Dementia  -C/w donepezil 10 mg p.o. nightly, memantine 10 mg p.o. twice daily, sertraline 25 mg p.o. daily -Palliative care consulted for further goals of care discussion and this is still pending   Hypoalbuminemia -Has Bilateral LE Swelling likely from 3rd Spacing -CXR showed Some small Bilateral Effusions -Will give a Dose of IV Lasix as above -Albumin Level was 2.3 and is now 2.6 -Continue to Monitor and may obtain a Pre-Albumin Level  DVT prophylaxis: Enoxaparin 30 mg subcu every 24 Code Status: FULL CODE  Family Communication: No family present at bedside  Disposition Plan: SNF   Status is: Inpatient  Remains inpatient appropriate because:Unsafe d/c plan, IV treatments appropriate due to intensity of illness or inability to take PO, and Inpatient level of care appropriate due to severity of illness  Dispo: The patient is from: Home              Anticipated d/c is to: SNF              Patient currently is not medically stable to d/c.   Difficult to place patient No  Consultants:  Palliative Care Medicine   Procedures: None  Antimicrobials:  Anti-infectives (From admission, onward)    Start     Dose/Rate Route Frequency Ordered Stop   12/18/20 1600  cefTRIAXone (ROCEPHIN) 2 g in sodium chloride 0.9 % 100 mL IVPB        2 g 200 mL/hr over 30 Minutes Intravenous Every 24 hours 12/17/20 2306 12/23/20 1559   12/18/20 1600  azithromycin (ZITHROMAX) 500 mg in sodium chloride 0.9 % 250 mL IVPB  Status:  Discontinued        500 mg 250 mL/hr over 60 Minutes Intravenous Every 24 hours 12/17/20 2306 12/18/20 1244   12/18/20 1330  azithromycin (ZITHROMAX) tablet 500 mg        500 mg Oral Daily 12/18/20 1244 12/22/20 0959   12/17/20 1545  azithromycin (ZITHROMAX) 500 mg in sodium chloride 0.9 % 250 mL IVPB        500 mg 250 mL/hr over 60 Minutes Intravenous  Once 12/17/20 1534 12/17/20 1759   12/17/20 1515  cefTRIAXone (ROCEPHIN) 1 g in sodium chloride 0.9 %  100 mL IVPB        1 g 200 mL/hr over 30 Minutes Intravenous  Once 12/17/20 1505 12/17/20 1643        Subjective: Seen and Examined at bedside and she is resting and she is extremely hard of hearing.  Does not wear oxygen at home.  Denies any pain.  Remains a little confused secondary to her dementia.  No other concerns or complaints at this time.  Objective: Vitals:   12/18/20 0638 12/18/20 1021 12/18/20 2008 12/19/20 0553  BP: (!) 161/87 140/84 (!) 151/88 (!) 119/96  Pulse: 95 78 87 78  Resp: '18 18 16 16  '$ Temp: 98.4 F (36.9 C) 98.3 F (36.8 C) 98 F (36.7 C) 97.9 F (36.6 C)  TempSrc:  Oral    SpO2: 100% 100% 99%   Weight:      Height:        Intake/Output Summary (Last 24 hours) at 12/19/2020 1435 Last data filed at 12/19/2020 0500  Gross per 24 hour  Intake 160 ml  Output 1550 ml  Net -1390 ml    Filed Weights   12/17/20 2324  Weight: 47.5 kg   Examination: Physical Exam:  Constitutional: Thin Chronically ill appearing elderly Caucasian female who is Somnolent but easily arousable; Appears a little uncomfortable Eyes: Lids normal but kept her eyes closed  ENMT: External Ears, Nose appear normal. A little hard of hearing.  Neck: Appears normal, supple, no cervical masses, normal ROM, no appreciable thyromegaly Respiratory: Diminished to auscultation bilaterally worse on the Right compared to the left. no wheezing, rales, rhonchi or crackles. Normal respiratory effort and patient is not tachypenic. No accessory muscle use. Not wearing Supplemental O2 via [place] Cardiovascular: RRR, no murmurs / rubs / gallops. S1 and S2 auscultated. 1+ LE pitting edmea  Abdomen: Soft, non-tender, non-distended. Bowel sounds positive.  GU: Deferred. Musculoskeletal: No clubbing / cyanosis of digits/nails. No joint deformity upper and lower extremities.  Skin: No rashes, lesions, ulcers on a limited skin evaluation. No induration; Warm and dry.  Neurologic: CN 2-12 grossly intact with no  focal deficits. Romberg sign cerebellar reflexes not assessed.  Psychiatric: Impaired judgment and insight. Somnolent and Drowsy and only oriented x 1. Normal mood and appropriate affect.   Data Reviewed: I have personally reviewed following labs and imaging studies  CBC: Recent Labs  Lab 12/17/20 1352 12/18/20 0459 12/19/20 0848  WBC 16.6* 14.3* 13.0*  NEUTROABS 13.6*  --  10.0*  HGB 13.5 12.1 12.9  HCT 42.8 38.6 41.5  MCV 96.2 98.5 96.7  PLT 278 240 [id number]    Basic Metabolic Panel: Recent Labs  Lab 12/17/20 1352 12/18/20 0459 12/19/20 0848  NA 140 141 143  K 4.1 4.0 3.8  CL 105 106 100  CO2 '26 26 30  '$ GLUCOSE 150* 118* 110*  BUN 25* 19 25*  CREATININE 0.98 0.77 0.99  CALCIUM 8.4* 8.7* 8.8*  MG  --   --  2.3  PHOS  --   --  4.4    GFR: Estimated Creatinine Clearance: 28.3 mL/min (by C-G formula based on SCr of 0.99 mg/dL). Liver Function Tests: Recent Labs  Lab 12/17/20 1352 12/18/20 0459 12/19/20 0848  AST '28 26 26  '$ ALT '18 21 22  '$ ALKPHOS 116 100 116  BILITOT 1.0 0.7 0.7  PROT 6.0* 5.2* 6.0*  ALBUMIN 2.7* 2.3* 2.6*    No results for input(s): LIPASE, AMYLASE in the last 168 hours. No results for input(s): AMMONIA in the last 168 hours. Coagulation Profile: Recent Labs  Lab 12/17/20 1352 12/17/20 2328  INR 1.2 1.1    Cardiac Enzymes: No results for input(s): CKTOTAL, CKMB, CKMBINDEX, TROPONINI in the last 168 hours. BNP (last 3 results) No results for input(s): PROBNP in the last 8760 hours. HbA1C: Recent Labs    12/17/20 2328  HGBA1C 5.9*    CBG: Recent Labs  Lab 12/18/20 1101 12/18/20 1627 12/18/20 2056 12/19/20 0716 12/19/20 1059  GLUCAP 102* 108* 99 114* 97    Lipid Profile: No results for input(s): CHOL, HDL, LDLCALC, TRIG, CHOLHDL, LDLDIRECT in the last 72 hours. Thyroid Function Tests: No results for input(s): TSH, T4TOTAL, FREET4, T3FREE, THYROIDAB in the last 72 hours. Anemia Panel: No results for input(s): VITAMINB12,  FOLATE, FERRITIN, TIBC, IRON, RETICCTPCT in the last 72 hours. Sepsis Labs: Recent Labs  Lab 12/17/20 1352 12/17/20 2311  LATICACIDVEN 1.7 1.2      RN Pressure Injury Documentation:     Estimated body mass index  is 18.55 kg/m as calculated from the following:   Height as of this encounter: '5\' 3"'$  (1.6 m).   Weight as of this encounter: 47.5 kg.  Malnutrition Type: Nutrition Problem: Severe Malnutrition Etiology: chronic illness (dementia) Malnutrition Characteristics: Signs/Symptoms: moderate fat depletion, moderate muscle depletion, severe fat depletion, severe muscle depletion Nutrition Interventions: Interventions: Liberalize Diet, Ensure Enlive (each supplement provides 350kcal and 20 grams of protein), MVI, Magic cup  Radiology Studies: DG CHEST PORT 1 VIEW  Result Date: 12/19/2020 CLINICAL DATA:  Shortness of breath. EXAM: PORTABLE CHEST 1 VIEW COMPARISON:  12/18/2020. FINDINGS: Stable cardiomegaly. No pulmonary venous congestion. Mitral annular calcification. Progressive right base infiltrate. Small right pleural effusion. No pneumothorax. Surgical clips right upper quadrant. IMPRESSION: 1. Progressive right base infiltrate consistent with pneumonia. Right pleural effusion again noted. 2.  Cardiomegaly again.  No pulmonary venous congestion. Electronically Signed   By: Marcello Moores  Register   On: 12/19/2020 10:15   DG CHEST PORT 1 VIEW  Result Date: 12/18/2020 CLINICAL DATA:  Fever, tachypnea and tachycardia today. EXAM: PORTABLE CHEST 1 VIEW COMPARISON:  Single-view of the chest 12/17/2020. FINDINGS: Small bilateral pleural effusions and basilar atelectasis are greater on the right and decreased since the prior examination. Cardiomegaly. No pneumothorax. Aortic atherosclerosis. IMPRESSION: Right greater than left small pleural effusions and basilar atelectasis appear improved compared to the prior exam. Electronically Signed   By: Inge Rise M.D.   On: 12/18/2020 12:23      Scheduled Meds:  aspirin  81 mg Oral Daily   azithromycin  500 mg Oral Daily   donepezil  10 mg Oral QHS   enoxaparin (LOVENOX) injection  30 mg Subcutaneous Q24H   feeding supplement  237 mL Oral BID BM   insulin aspart  0-9 Units Subcutaneous TID WC   memantine  10 mg Oral BID   multivitamin with minerals  1 tablet Oral Daily   sertraline  25 mg Oral Daily   sodium chloride flush  3 mL Intravenous Q12H   Continuous Infusions:  sodium chloride 250 mL (12/17/20 2352)   cefTRIAXone (ROCEPHIN)  IV 2 g (12/18/20 1453)    LOS: 2 days   Kerney Elbe, DO [facility] PAGER is on AMION  If 7PM-7AM, please contact night-coverage [email or web address]

## 2020-12-19 NOTE — NC FL2 (Signed)
[place] LEVEL OF CARE SCREENING TOOL     IDENTIFICATION  Patient Name: Jessica Benton Birthdate: [birth year] Sex: female Admission Date (Current Location): 12/17/2020  [facility] and [place] Number:  Herbalist and Address:  [facility],  [place], [place]      Provider Number:    Attending Physician Name and Address:  Kerney Elbe, DO  Relative Name and Phone Number:  Maryanna Shape dtr [phone]    Current Level of Care: Hospital Recommended Level of Care: [place] Prior Approval Number:    Date Approved/Denied:   PASRR Number:    Discharge Plan: SNF    Current Diagnoses: Patient Active Problem List   Diagnosis Date Noted   Protein-calorie malnutrition, severe 12/18/2020   Pyelonephritis 12/17/2020   Mixed Alzheimer's and vascular dementia ([place]) 06/13/2018   Gait disorder 06/13/2018    Orientation RESPIRATION BLADDER Height & Weight     Self  O2 Incontinent Weight: 47.5 kg Height:  '5\' 3"'$  (160 cm)  BEHAVIORAL SYMPTOMS/MOOD NEUROLOGICAL BOWEL NUTRITION STATUS      Incontinent Diet (2 gm na)  AMBULATORY STATUS COMMUNICATION OF NEEDS Skin   Total Care Verbally Normal                       Personal Care Assistance Level of Assistance  Bathing, Feeding, Dressing, Total care Bathing Assistance: Limited assistance Feeding assistance: Limited assistance Dressing Assistance: Maximum assistance Total Care Assistance: Maximum assistance   Functional Limitations Info  Sight, Hearing, Speech Sight Info: Adequate Hearing Info: Adequate Speech Info: Adequate    SPECIAL CARE FACTORS FREQUENCY  PT (By licensed PT), OT (By licensed OT)     PT Frequency:  (3x week) OT Frequency:  (3x week)            Contractures Contractures Info: Not present    Additional Factors Info  Code Status, Allergies, Psychotropic Code Status Info:  (Full) Allergies Info:  (NKA) Psychotropic Info:   (aricept;namenda,& zoloft see MAR)         Current Medications (12/19/2020):  This is the current hospital active medication list Current Facility-Administered Medications  Medication Dose Route Frequency Provider Last Rate Last Admin   0.9 %  sodium chloride infusion  250 mL Intravenous PRN Donnamae Jude, MD 10 mL/hr at 12/17/20 2352 250 mL at 12/17/20 2352   acetaminophen (TYLENOL) tablet 650 mg  650 mg Oral Q6H PRN Donnamae Jude, MD       aspirin chewable tablet 81 mg  81 mg Oral Daily Donnamae Jude, MD   81 mg at 12/19/20 1132   azithromycin (ZITHROMAX) tablet 500 mg  500 mg Oral Daily Raiford Noble [place], DO   500 mg at 12/19/20 1132   cefTRIAXone (ROCEPHIN) 2 g in sodium chloride 0.9 % 100 mL IVPB  2 g Intravenous Q24H Donnamae Jude, MD 200 mL/hr at 12/18/20 1453 2 g at 12/18/20 1453   diclofenac Sodium (VOLTAREN) 1 % topical gel 4 g  4 g Topical Q8H PRN Donnamae Jude, MD       donepezil (ARICEPT) tablet 10 mg  10 mg Oral QHS Donnamae Jude, MD   10 mg at 12/18/20 2057   enoxaparin (LOVENOX) injection 30 mg  30 mg Subcutaneous Q24H Donnamae Jude, MD   30 mg at 12/19/20 1132   feeding supplement (ENSURE ENLIVE / ENSURE PLUS) liquid 237 mL  237  mL Oral BID BM Raiford Noble [place], DO   237 mL at 12/19/20 1229   HYDROcodone-acetaminophen (NORCO/VICODIN) 5-325 MG per tablet 1-2 tablet  1-2 tablet Oral Q4H PRN Donnamae Jude, MD       insulin aspart (novoLOG) injection 0-9 Units  0-9 Units Subcutaneous TID WC Donnamae Jude, MD       memantine [facility]) tablet 10 mg  10 mg Oral BID Donnamae Jude, MD   10 mg at 12/19/20 1132   metoprolol tartrate (LOPRESSOR) injection 5 mg  5 mg Intravenous Q6H PRN Donnamae Jude, MD       multivitamin with minerals tablet 1 tablet  1 tablet Oral Daily Raiford Noble [place], DO   1 tablet at 12/19/20 1132   ondansetron (ZOFRAN) tablet 4 mg  4 mg Oral Q6H PRN Donnamae Jude, MD       Or   ondansetron [facility]) injection 4 mg  4 mg Intravenous Q6H PRN Donnamae Jude, MD       polyethylene glycol (MIRALAX / GLYCOLAX) packet 17 g  17 g Oral Daily PRN Donnamae Jude, MD       sertraline (ZOLOFT) tablet 25 mg  25 mg Oral Daily Donnamae Jude, MD   25 mg at 12/19/20 1131   sodium chloride flush (NS) 0.9 % injection 3 mL  3 mL Intravenous Q12H Donnamae Jude, MD       sodium chloride flush (NS) 0.9 % injection 3 mL  3 mL Intravenous PRN Donnamae Jude, MD         Discharge Medications: Please see discharge summary for a list of discharge medications.  Relevant Imaging Results:  Relevant Lab Results:   Additional Information  [phone])  Jordie Schreur, Juliann Pulse, RN

## 2020-12-19 NOTE — Evaluation (Signed)
SLP Cancellation Note  Patient Details Name: Jessica Benton MRN: [id number] DOB: 11-16-1928   Cancelled treatment:       Reason Eval/Treat Not Completed: Other (comment) (Per RN, pt refusing po x medications.  Will follow up next date in am for evaluation -hopefully with pt more agreeable to po.) RN informed and advised it was a good plan.    Kathleen Lime, MS [facility] SLP Acute Rehab Services Office [phone] Pager [phone]   Macario Golds 12/19/2020, 2:01 PM

## 2020-12-19 NOTE — Progress Notes (Signed)
PROGRESS NOTE    Jessica Benton  [id number] DOB: June 11, 1928 DOA: 12/17/2020 PCP: Bartholome Bill, MD   Brief Narrative:  The patient is a 87 year old elderly Caucasian female with a past medical history significant for but not limited to vascular dementia as well as other comorbidities who was recently placed in [place] the last month or so.  They are currently awaiting bed to get memory care section and per her daughter did not seem to be her usual self so she brought to the emergency room for further evaluation.  In the ED she is found to be tachycardic and tachypneic with a fever of 101.4.  She was given a working diagnosis of sepsis secondary to UTI as well as possible concomitant pneumonia and has been initiated on antibiotics.  She is also noted to be in acute hypoxic respiratory failure secondary to her likely pneumonia.  We will be obtaining SLP evaluation to check for aspiration and this is still pending. Repeat CXR this AM showed ". Progressive right base infiltrate consistent with pneumonia. Right pleural effusion again noted. Cardiomegaly again.  No pulmonary venous congestion."  Assessment & Plan:   Principal Problem:   Pyelonephritis Active Problems:   Mixed Alzheimer's and vascular dementia ([place])   Protein-calorie malnutrition, severe  Sepsis 2/2 UTI/Pyelonephritis with concomitant Right sided pneumonia present on admission -Presented with a fever of 101.4, tachycardia of 111, elevated respiratory rate of 44, and a leukocytosis of 16.6 which is now improved to 13.0 now -Urinalysis done and showed a cloudy appearance with large leukocytes, positive nitrites, urine protein of 30, present amorphous crystals, many bacteria in the urine, 0-5 RBCs per high-power field, and 6-10 WBCs -Urine culture showing >100000 of Proteus Mirabilis and 40,000 of E Coli with Sensitivities Pending; -Blood culture x2 still pending but showing NGTD at 1 Day  -Initial chest x-ray  showed "New small to moderate bilateral pleural effusions and basilar airspace disease which could be due to atelectasis or pneumonia. Aortic Atherosclerosis." -Repeat chest x-ray this AM showed ". Progressive right base infiltrate consistent with pneumonia. Right pleural effusion again noted. Cardiomegaly again.  No pulmonary venous congestion." -Patient was wearing supplemental oxygen via nasal cannula and does not wear oxygen at home -Continue with antibiotic coverage with IV ceftriaxone and azithromycin and will continue  -Lactic acid levels 1.7 now trended down to 1.2  -Continue to monitor temperature curve as well as WBC -Check SLP evaluation to evaluate for aspiration and this is pending  -Repeat CXR in the AM  -PT OT recommending SNF   Acute Respiratory Failure with Hypoxia -In the setting of Above with ? Pneumonia  -SpO2: 99 % O2 Flow Rate (L/min): 4 L/min; Now off of Supplemental O2  -Given adose of IV Lasix 40 mg x1 yesterday -Continuous Pulse Oximetry and Maintain O2 Saturations >90% -Continue Supplemental O2 via [place] and Wean to Room Air -Repeat CXR in the AM and will need an Ambulatory Home O2 Screen  Severe malnutrition in the context of chronic illness -Nutritionist consulted for further evaluation and diet will be liberalized from a heart healthy carb modified to 2 g sodium diet  -Estimated body mass index is 18.55 kg/m as calculated from the following:   Height as of this encounter: '5\' 3"'$  (1.6 m).   Weight as of this encounter: 47.5 kg. -Nutritionist is recommending Ensure Enlive twice daily as well as 30 mL of Prosource plus twice daily and 1 tab with multivitamin with minerals daily  Mixed  Alzheimer's and Vascular Dementia, End-Stage Acute Confusion Superimposed on Chronic Dementia  -C/w donepezil 10 mg p.o. nightly, memantine 10 mg p.o. twice daily, sertraline 25 mg p.o. daily -Palliative care consulted for further goals of care discussion and this is still pending    Hypoalbuminemia -Has Bilateral LE Swelling likely from 3rd Spacing -CXR showed Some small Bilateral Effusions -Will give a Dose of IV Lasix as above -Albumin Level was 2.3 and is now 2.6 -Continue to Monitor and may obtain a Pre-Albumin Level  DVT prophylaxis: Enoxaparin 30 mg subcu every 24 Code Status: FULL CODE  Family Communication: No family present at bedside  Disposition Plan: SNF   Status is: Inpatient  Remains inpatient appropriate because:Unsafe d/c plan, IV treatments appropriate due to intensity of illness or inability to take PO, and Inpatient level of care appropriate due to severity of illness  Dispo: The patient is from: Home              Anticipated d/c is to: SNF              Patient currently is not medically stable to d/c.   Difficult to place patient No  Consultants:  Palliative Care Medicine   Procedures: None  Antimicrobials:  Anti-infectives (From admission, onward)    Start     Dose/Rate Route Frequency Ordered Stop   12/18/20 1600  cefTRIAXone (ROCEPHIN) 2 g in sodium chloride 0.9 % 100 mL IVPB        2 g 200 mL/hr over 30 Minutes Intravenous Every 24 hours 12/17/20 2306 12/23/20 1559   12/18/20 1600  azithromycin (ZITHROMAX) 500 mg in sodium chloride 0.9 % 250 mL IVPB  Status:  Discontinued        500 mg 250 mL/hr over 60 Minutes Intravenous Every 24 hours 12/17/20 2306 12/18/20 1244   12/18/20 1330  azithromycin (ZITHROMAX) tablet 500 mg        500 mg Oral Daily 12/18/20 1244 12/22/20 0959   12/17/20 1545  azithromycin (ZITHROMAX) 500 mg in sodium chloride 0.9 % 250 mL IVPB        500 mg 250 mL/hr over 60 Minutes Intravenous  Once 12/17/20 1534 12/17/20 1759   12/17/20 1515  cefTRIAXone (ROCEPHIN) 1 g in sodium chloride 0.9 % 100 mL IVPB        1 g 200 mL/hr over 30 Minutes Intravenous  Once 12/17/20 1505 12/17/20 1643        Subjective: Seen and Examined at bedside and she is resting and she is extremely hard of hearing.  Does not wear  oxygen at home.  Denies any pain.  Remains a little confused secondary to her dementia.  No other concerns or complaints at this time.  Objective: Vitals:   12/18/20 0638 12/18/20 1021 12/18/20 2008 12/19/20 0553  BP: (!) 161/87 140/84 (!) 151/88 (!) 119/96  Pulse: 95 78 87 78  Resp: '18 18 16 16  '$ Temp: 98.4 F (36.9 C) 98.3 F (36.8 C) 98 F (36.7 C) 97.9 F (36.6 C)  TempSrc:  Oral    SpO2: 100% 100% 99%   Weight:      Height:        Intake/Output Summary (Last 24 hours) at 12/19/2020 1342 Last data filed at 12/19/2020 0500 Gross per 24 hour  Intake 200 ml  Output 1550 ml  Net -1350 ml    Filed Weights   12/17/20 2324  Weight: 47.5 kg   Examination: Physical Exam:  Constitutional: Thin Chronically ill  appearing elderly Caucasian female who is Somnolent but easily arousable; Appears a little uncomfortable Eyes: Lids normal but kept her eyes closed  ENMT: External Ears, Nose appear normal. A little hard of hearing.  Neck: Appears normal, supple, no cervical masses, normal ROM, no appreciable thyromegaly Respiratory: Diminished to auscultation bilaterally worse on the Right compared to the left. no wheezing, rales, rhonchi or crackles. Normal respiratory effort and patient is not tachypenic. No accessory muscle use. Not wearing Supplemental O2 via [place] Cardiovascular: RRR, no murmurs / rubs / gallops. S1 and S2 auscultated. 1+ LE pitting edmea  Abdomen: Soft, non-tender, non-distended. Bowel sounds positive.  GU: Deferred. Musculoskeletal: No clubbing / cyanosis of digits/nails. No joint deformity upper and lower extremities.  Skin: No rashes, lesions, ulcers on a limited skin evaluation. No induration; Warm and dry.  Neurologic: CN 2-12 grossly intact with no focal deficits. Romberg sign cerebellar reflexes not assessed.  Psychiatric: Impaired judgment and insight. Somnolent and Drowsy and only oriented x 1. Normal mood and appropriate affect.   Data Reviewed: I have  personally reviewed following labs and imaging studies  CBC: Recent Labs  Lab 12/17/20 1352 12/18/20 0459 12/19/20 0848  WBC 16.6* 14.3* 13.0*  NEUTROABS 13.6*  --  10.0*  HGB 13.5 12.1 12.9  HCT 42.8 38.6 41.5  MCV 96.2 98.5 96.7  PLT 278 240 [id number]    Basic Metabolic Panel: Recent Labs  Lab 12/17/20 1352 12/18/20 0459 12/19/20 0848  NA 140 141 143  K 4.1 4.0 3.8  CL 105 106 100  CO2 '26 26 30  '$ GLUCOSE 150* 118* 110*  BUN 25* 19 25*  CREATININE 0.98 0.77 0.99  CALCIUM 8.4* 8.7* 8.8*  MG  --   --  2.3  PHOS  --   --  4.4    GFR: Estimated Creatinine Clearance: 28.3 mL/min (by C-G formula based on SCr of 0.99 mg/dL). Liver Function Tests: Recent Labs  Lab 12/17/20 1352 12/18/20 0459 12/19/20 0848  AST '28 26 26  '$ ALT '18 21 22  '$ ALKPHOS 116 100 116  BILITOT 1.0 0.7 0.7  PROT 6.0* 5.2* 6.0*  ALBUMIN 2.7* 2.3* 2.6*    No results for input(s): LIPASE, AMYLASE in the last 168 hours. No results for input(s): AMMONIA in the last 168 hours. Coagulation Profile: Recent Labs  Lab 12/17/20 1352 12/17/20 2328  INR 1.2 1.1    Cardiac Enzymes: No results for input(s): CKTOTAL, CKMB, CKMBINDEX, TROPONINI in the last 168 hours. BNP (last 3 results) No results for input(s): PROBNP in the last 8760 hours. HbA1C: Recent Labs    12/17/20 2328  HGBA1C 5.9*   CBG: Recent Labs  Lab 12/18/20 1101 12/18/20 1627 12/18/20 2056 12/19/20 0716 12/19/20 1059  GLUCAP 102* 108* 99 114* 97    Lipid Profile: No results for input(s): CHOL, HDL, LDLCALC, TRIG, CHOLHDL, LDLDIRECT in the last 72 hours. Thyroid Function Tests: No results for input(s): TSH, T4TOTAL, FREET4, T3FREE, THYROIDAB in the last 72 hours. Anemia Panel: No results for input(s): VITAMINB12, FOLATE, FERRITIN, TIBC, IRON, RETICCTPCT in the last 72 hours. Sepsis Labs: Recent Labs  Lab 12/17/20 1352 12/17/20 2311  LATICACIDVEN 1.7 1.2      RN Pressure Injury Documentation:     Estimated body mass  index is 18.55 kg/m as calculated from the following:   Height as of this encounter: '5\' 3"'$  (1.6 m).   Weight as of this encounter: 47.5 kg.  Malnutrition Type: Nutrition Problem: Severe Malnutrition Etiology: chronic illness (dementia) Malnutrition  Characteristics: Signs/Symptoms: moderate fat depletion, moderate muscle depletion, severe fat depletion, severe muscle depletion Nutrition Interventions: Interventions: Liberalize Diet, Ensure Enlive (each supplement provides 350kcal and 20 grams of protein), MVI, Magic cup  Radiology Studies: DG CHEST PORT 1 VIEW  Result Date: 12/19/2020 CLINICAL DATA:  Shortness of breath. EXAM: PORTABLE CHEST 1 VIEW COMPARISON:  12/18/2020. FINDINGS: Stable cardiomegaly. No pulmonary venous congestion. Mitral annular calcification. Progressive right base infiltrate. Small right pleural effusion. No pneumothorax. Surgical clips right upper quadrant. IMPRESSION: 1. Progressive right base infiltrate consistent with pneumonia. Right pleural effusion again noted. 2.  Cardiomegaly again.  No pulmonary venous congestion. Electronically Signed   By: Marcello Moores  Register   On: 12/19/2020 10:15   DG CHEST PORT 1 VIEW  Result Date: 12/18/2020 CLINICAL DATA:  Fever, tachypnea and tachycardia today. EXAM: PORTABLE CHEST 1 VIEW COMPARISON:  Single-view of the chest 12/17/2020. FINDINGS: Small bilateral pleural effusions and basilar atelectasis are greater on the right and decreased since the prior examination. Cardiomegaly. No pneumothorax. Aortic atherosclerosis. IMPRESSION: Right greater than left small pleural effusions and basilar atelectasis appear improved compared to the prior exam. Electronically Signed   By: Inge Rise M.D.   On: 12/18/2020 12:23   DG Chest Port 1 View  Result Date: 12/17/2020 CLINICAL DATA:  Fever and fatigue. EXAM: PORTABLE CHEST 1 VIEW COMPARISON:  PA and lateral chest 11/28/2020 and 07/21/2006. FINDINGS: The patient has new small to moderate  bilateral pleural effusions and basilar airspace disease. There is cardiomegaly. Aortic atherosclerosis. No pneumothorax. No acute or focal bony abnormality. IMPRESSION: New small to moderate bilateral pleural effusions and basilar airspace disease which could be due to atelectasis or pneumonia. Aortic Atherosclerosis (ICD10-I70.0). Electronically Signed   By: Inge Rise M.D.   On: 12/17/2020 14:33     Scheduled Meds:  aspirin  81 mg Oral Daily   azithromycin  500 mg Oral Daily   donepezil  10 mg Oral QHS   enoxaparin (LOVENOX) injection  30 mg Subcutaneous Q24H   feeding supplement  237 mL Oral BID BM   insulin aspart  0-9 Units Subcutaneous TID WC   memantine  10 mg Oral BID   multivitamin with minerals  1 tablet Oral Daily   sertraline  25 mg Oral Daily   sodium chloride flush  3 mL Intravenous Q12H   Continuous Infusions:  sodium chloride 250 mL (12/17/20 2352)   cefTRIAXone (ROCEPHIN)  IV 2 g (12/18/20 1453)    LOS: 2 days   Kerney Elbe, DO [facility] PAGER is on AMION  If 7PM-7AM, please contact night-coverage [email or web address]

## 2020-12-19 NOTE — Progress Notes (Signed)
Palliative:  Consult received. Chart reviewed. Patient sleeping. No family at bedside. Called daughter - she shares it is not a good time to talk. We scheduled a meeting for 7/28 at 14:00.  Juel Burrow, DNP, AGNP-C Palliative Medicine Team Team Phone # [phone]  Pager # [phone]  NO CHARGE

## 2020-12-20 ENCOUNTER — Inpatient Hospital Stay (HOSPITAL_COMMUNITY): Payer: Medicare PPO

## 2020-12-20 DIAGNOSIS — N12 Tubulo-interstitial nephritis, not specified as acute or chronic: Secondary | ICD-10-CM | POA: Diagnosis not present

## 2020-12-20 DIAGNOSIS — E43 Unspecified severe protein-calorie malnutrition: Secondary | ICD-10-CM | POA: Diagnosis not present

## 2020-12-20 DIAGNOSIS — R627 Adult failure to thrive: Secondary | ICD-10-CM

## 2020-12-20 DIAGNOSIS — Z7189 Other specified counseling: Secondary | ICD-10-CM | POA: Diagnosis not present

## 2020-12-20 DIAGNOSIS — G309 Alzheimer's disease, unspecified: Secondary | ICD-10-CM | POA: Diagnosis not present

## 2020-12-20 DIAGNOSIS — Z515 Encounter for palliative care: Secondary | ICD-10-CM

## 2020-12-20 DIAGNOSIS — Z66 Do not resuscitate: Secondary | ICD-10-CM

## 2020-12-20 DIAGNOSIS — J189 Pneumonia, unspecified organism: Secondary | ICD-10-CM | POA: Diagnosis not present

## 2020-12-20 LAB — URINE CULTURE: Culture: >=100000 — AB

## 2020-12-20 LAB — CBC WITH DIFFERENTIAL/PLATELET
Abs Immature Granulocytes: 0.25 10*3/uL — ABNORMAL HIGH (ref 0.00–0.07)
Basophils Absolute: 0.1 10*3/uL (ref 0.0–0.1)
Basophils Relative: 1 %
Eosinophils Absolute: 0.2 10*3/uL (ref 0.0–0.5)
Eosinophils Relative: 2 %
HCT: 41.9 % (ref 36.0–46.0)
Hemoglobin: 12.7 g/dL (ref 12.0–15.0)
Immature Granulocytes: 2 %
Lymphocytes Relative: 9 %
Lymphs Abs: 1.1 10*3/uL (ref 0.7–4.0)
MCH: 30.2 pg (ref 26.0–34.0)
MCHC: 30.3 g/dL (ref 30.0–36.0)
MCV: 99.5 fL (ref 80.0–100.0)
Monocytes Absolute: 1.1 10*3/uL — ABNORMAL HIGH (ref 0.1–1.0)
Monocytes Relative: 9 %
Neutro Abs: 9.4 10*3/uL — ABNORMAL HIGH (ref 1.7–7.7)
Neutrophils Relative %: 77 %
Platelets: 272 10*3/uL (ref 150–400)
RBC: 4.21 MIL/uL (ref 3.87–5.11)
RDW: 13.6 % (ref 11.5–15.5)
WBC: 12.2 10*3/uL — ABNORMAL HIGH (ref 4.0–10.5)
nRBC: 0 % (ref 0.0–0.2)

## 2020-12-20 LAB — COMPREHENSIVE METABOLIC PANEL
ALT: 20 U/L (ref 0–44)
AST: 23 U/L (ref 15–41)
Albumin: 2.3 g/dL — ABNORMAL LOW (ref 3.5–5.0)
Alkaline Phosphatase: 102 U/L (ref 38–126)
Anion gap: 11 (ref 5–15)
BUN: 25 mg/dL — ABNORMAL HIGH (ref 8–23)
CO2: 28 mmol/L (ref 22–32)
Calcium: 8.6 mg/dL — ABNORMAL LOW (ref 8.9–10.3)
Chloride: 101 mmol/L (ref 98–111)
Creatinine, Ser: 0.81 mg/dL (ref 0.44–1.00)
GFR, Estimated: >60 mL/min (ref >60)
Glucose, Bld: 115 mg/dL — ABNORMAL HIGH (ref 70–99)
Potassium: 3.3 mmol/L — ABNORMAL LOW (ref 3.5–5.1)
Sodium: 140 mmol/L (ref 135–145)
Total Bilirubin: 0.9 mg/dL (ref 0.3–1.2)
Total Protein: 5.4 g/dL — ABNORMAL LOW (ref 6.5–8.1)

## 2020-12-20 LAB — GLUCOSE, CAPILLARY
Glucose-Capillary: 87 mg/dL (ref 70–99)
Glucose-Capillary: 100 mg/dL — ABNORMAL HIGH (ref 70–99)
Glucose-Capillary: 115 mg/dL — ABNORMAL HIGH (ref 70–99)
Glucose-Capillary: 97 mg/dL (ref 70–99)

## 2020-12-20 LAB — PHOSPHORUS: Phosphorus: 3.2 mg/dL (ref 2.5–4.6)

## 2020-12-20 LAB — MAGNESIUM: Magnesium: 2.2 mg/dL (ref 1.7–2.4)

## 2020-12-20 MED ORDER — POTASSIUM CHLORIDE CRYS ER 20 MEQ PO TBCR
40.0000 meq | EXTENDED_RELEASE_TABLET | Freq: Once | ORAL | Status: DC
  Filled 2020-12-20: qty 2

## 2020-12-20 NOTE — Consult Note (Signed)
Consultation Note Date: 12/20/2020   Patient Name: Jessica Benton  DOB: 03/08/1929  MRN: [id number]  Age / Sex: 87 y.o., female  PCP: Bartholome Bill, MD Referring Physician: Kerney Elbe, DO  Reason for Consultation: Establishing goals of care  HPI/Patient Profile: 87 y.o. female  with past medical history of vascular dementia admitted on 12/17/2020 with AMS. Found to have severe sepsis secondary to pyelonephritis. Also found to have R sided pna. Concern for aspiration but unable to obtain swallow eval as patient refusing all PO intake. Lethargy and poor PO intake have continued throughout hospitalization. PMT consulted to discuss [place].   Clinical Assessment and Goals of Care: I have reviewed medical records including EPIC notes, labs and imaging, received report from RN, assessed the patient and then met with patient's son and daughter  to discuss diagnosis prognosis, GOC, EOL wishes, disposition and options.  I introduced Palliative Medicine as specialized medical care for people living with serious illness. It focuses on providing relief from the symptoms and stress of a serious illness. The goal is to improve quality of life for both the patient and the family.  We discussed a brief life review of the patient. They share that patient has been married to her spouse for 54 years.   As far as functional and nutritional status, they share about a rapid decline over the past month. Patient and her spouse were recently placed in ALF d/t safety concerns at home. Since that time patient's functions has declined - almost no walking for several weeks. Her appetite has also declined. Cognitive decline reported as well - at times patient does not recognize daughter. Family reports she sleeps most of the time.    We discussed patient's current illness and what it means in the larger context of patient's on-going co-morbidities.  Natural disease  trajectory and expectations at EOL were discussed. Discuss chronic progressive nature of dementia. Discuss concern that patient is nearing end of life d/t her functional, nutritional, and cognitive decline.   I attempted to elicit values and goals of care important to the patient.  Family shares that patient has a living will.   The difference between aggressive medical intervention and comfort care was considered in light of the patient's goals of care.   Advance directives, concepts specific to code status, artificial feeding and hydration, and rehospitalization were considered and discussed.   I completed a MOST form today. The patient and family outlined their wishes for the following treatment decisions:  Cardiopulmonary Resuscitation: Do Not Attempt Resuscitation (DNR/No CPR)  Medical Interventions: Limited Additional Interventions: Use medical treatment, IV fluids and cardiac monitoring as indicated, DO NOT USE intubation or mechanical ventilation. May consider use of less invasive airway support such as BiPAP or CPAP. Also provide comfort measures. Transfer to the hospital if indicated. Avoid intensive care.   Antibiotics: Antibiotics if indicated  IV Fluids: IV fluids if indicated  Feeding Tube: No feeding tube   Family is not sure that they want patient to return to hospital but also not sure they would not want her to - would be situation dependent - they prefer to make that decision when situation arises.   Discussed with family the importance of continued conversation with family and the medical providers regarding overall plan of care and treatment options, ensuring decisions are within the context of the patient's values and GOCs.    Hospice services outpatient were explained and offered. Family is interested in hospice support at patient's ALF.  Questions and concerns were addressed. The family was encouraged to call with questions or concerns.    Primary Decision  Maker NEXT OF KIN - son and daughter    SUMMARY OF RECOMMENDATIONS   - code status change to DNR - plan to move back to ALF with support of hospice - family request hospital bed - TOC aware - family aware of poor prognosis  - ~months likely - could be less depending on PO intake outpatient - MOST completed  Code Status/Advance Care Planning: DNR  Additional Recommendations (Limitations, Scope, Preferences): No Artificial Feeding  Prognosis:  < 6 months  Discharge Planning:  ALF with hospice suppor       Primary Diagnoses: Present on Admission:  Pyelonephritis  Mixed Alzheimer's and vascular dementia (HCC)   I have reviewed the medical record, interviewed the patient and family, and examined the patient. The following aspects are pertinent.  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  Tobacco Use   Smoking status: Never   Smokeless tobacco: Never  Vaping Use   Vaping Use: Never used  Substance and Sexual Activity   Alcohol use: No   Drug use: No   Sexual activity: Not on file  Other Topics Concern   Not on file  Social History Narrative   Lives with husband   Caffeine use: only decaf   Right handed    Social Determinants of Health   Financial Resource Strain: Not on file  Food Insecurity: Not on file  Transportation Needs: Not on file  Physical Activity: Not on file  Stress: Not on file  Social Connections: Not on file   Family History  Problem Relation Age of Onset   Parkinsonism Mother    Dementia Mother    Heart attack Father    Scheduled Meds:  aspirin  81 mg Oral Daily   azithromycin  500 mg  Oral Daily   donepezil  10 mg Oral QHS   enoxaparin (LOVENOX) injection  30 mg Subcutaneous Q24H   feeding supplement  237 mL Oral BID BM   insulin aspart  0-9 Units Subcutaneous TID WC   memantine  10 mg Oral BID   multivitamin with minerals  1 tablet Oral Daily   sertraline  25 mg Oral Daily   sodium chloride flush  3 mL Intravenous Q12H   Continuous Infusions:  sodium chloride 250 mL (12/17/20 2352)   cefTRIAXone (ROCEPHIN)  IV 2 g (12/19/20 1655)   PRN Meds:.sodium chloride, acetaminophen, diclofenac Sodium, HYDROcodone-acetaminophen, metoprolol tartrate, ondansetron **OR** ondansetron (ZOFRAN) IV, polyethylene glycol, sodium chloride flush No Known Allergies Review of Systems  Unable to perform ROS: Dementia   Physical Exam Constitutional:      Comments: lethargic  Pulmonary:     Effort: Pulmonary effort is normal.  Skin:    General: Skin is warm and dry.  Neurological:     Mental Status: She is disoriented.    Vital Signs: BP (!) 160/84 (BP Location: Right Arm)   Pulse 82   Temp 98.2 F (36.8 C)   Resp 16  Ht '5\' 3"'$  (1.6 m)   Wt 47.5 kg   SpO2 93%   BMI 18.55 kg/m  Pain Scale: PAINAD   Pain Score: 0-No pain   SpO2: SpO2: 93 % O2 Device:SpO2: 93 % O2 Flow Rate: .O2 Flow Rate (L/min): 4 L/min  IO: Intake/output summary:  Intake/Output Summary (Last 24 hours) at 12/20/2020 1249 Last data filed at 12/19/2020 1739 Gross per 24 hour  Intake --  Output 250 ml  Net -250 ml    LBM: Last BM Date: 12/19/20 Baseline Weight: Weight: 47.5 kg Most recent weight: Weight: 47.5 kg     Palliative Assessment/Data: PPS 20%   Flowsheet Rows    Flowsheet Row Most Recent Value  Intake Tab   Referral Department Hospitalist  Unit at Time of Referral Med/Surg Unit  Palliative Care Primary Diagnosis Sepsis/Infectious Disease  Date Notified 12/17/20  Palliative Care Type New Palliative care  Reason for referral Clarify Goals of Care  Date of Admission 12/17/20  Date  first seen by Palliative Care 12/19/20  # of days Palliative referral response time 2 Day(s)  # of days IP prior to Palliative referral 0  Clinical Assessment   Psychosocial & Spiritual Assessment   Palliative Care Outcomes       Time Total: 95 minutes Greater than 50%  of this time was spent counseling and coordinating care related to the above assessment and plan.  Juel Burrow, DNP, AGNP-C Palliative Medicine Team [phone] Pager: [phone]

## 2020-12-20 NOTE — NC FL2 (Signed)
[place] LEVEL OF CARE SCREENING TOOL     IDENTIFICATION  Patient Name: Jessica Benton Birthdate: [birth year] Sex: female Admission Date (Current Location): 12/17/2020  [facility] and [place] Number:  Herbalist and Address:  [facility],  [place], [place]      Provider Number:    Attending Physician Name and Address:  Kerney Elbe, DO  Relative Name and Phone Number:  Maryanna Shape dtr [phone]    Current Level of Care: Hospital Recommended Level of Care: [place] (ALF) Prior Approval Number:    Date Approved/Denied:   PASRR Number:  ([id number])  Discharge Plan: Other (Comment) (ALF)    Current Diagnoses: Patient Active Problem List   Diagnosis Date Noted   Protein-calorie malnutrition, severe 12/18/2020   Pyelonephritis 12/17/2020   Mixed Alzheimer's and vascular dementia ([place]) 06/13/2018   Gait disorder 06/13/2018    Orientation RESPIRATION BLADDER Height & Weight     Self  O2 Incontinent Weight: 47.5 kg Height:  '5\' 3"'$  (160 cm)  BEHAVIORAL SYMPTOMS/MOOD NEUROLOGICAL BOWEL NUTRITION STATUS      Incontinent Diet (2 gm na)  AMBULATORY STATUS COMMUNICATION OF NEEDS Skin   Total Care Verbally Normal                       Personal Care Assistance Level of Assistance  Bathing, Feeding, Dressing, Total care Bathing Assistance: Limited assistance Feeding assistance: Limited assistance Dressing Assistance: Maximum assistance Total Care Assistance: Maximum assistance   Functional Limitations Info  Sight, Hearing, Speech Sight Info: Adequate Hearing Info: Adequate Speech Info: Adequate    SPECIAL CARE FACTORS FREQUENCY  PT (By licensed PT), OT (By licensed OT)     PT Frequency:  (3x week) OT Frequency:  (3x week)            Contractures Contractures Info: Not present    Additional Factors Info  Code Status, Allergies, Psychotropic Code Status Info:   (Full) Allergies Info:  (NKA) Psychotropic Info:  (aricept;namenda,& zoloft see MAR)         Current Medications (12/20/2020):  This is the current hospital active medication list Current Facility-Administered Medications  Medication Dose Route Frequency Provider Last Rate Last Admin   0.9 %  sodium chloride infusion  250 mL Intravenous PRN Donnamae Jude, MD 10 mL/hr at 12/17/20 2352 250 mL at 12/17/20 2352   acetaminophen (TYLENOL) tablet 650 mg  650 mg Oral Q6H PRN Donnamae Jude, MD       aspirin chewable tablet 81 mg  81 mg Oral Daily Donnamae Jude, MD   81 mg at 12/20/20 0908   azithromycin (ZITHROMAX) tablet 500 mg  500 mg Oral Daily Raiford Noble [place], DO   500 mg at 12/20/20 0908   cefTRIAXone (ROCEPHIN) 2 g in sodium chloride 0.9 % 100 mL IVPB  2 g Intravenous Q24H Donnamae Jude, MD 200 mL/hr at 12/19/20 1655 2 g at 12/19/20 1655   diclofenac Sodium (VOLTAREN) 1 % topical gel 4 g  4 g Topical Q8H PRN Donnamae Jude, MD       donepezil (ARICEPT) tablet 10 mg  10 mg Oral QHS Donnamae Jude, MD   10 mg at 12/19/20 2158   enoxaparin (LOVENOX) injection 30 mg  30 mg Subcutaneous Q24H Donnamae Jude, MD   30 mg at 12/20/20 0908   feeding supplement (ENSURE ENLIVE / ENSURE PLUS) liquid 237  mL  237 mL Oral BID BM Raiford Noble [place], DO   237 mL at 12/20/20 [id number]   HYDROcodone-acetaminophen (NORCO/VICODIN) 5-325 MG per tablet 1-2 tablet  1-2 tablet Oral Q4H PRN Donnamae Jude, MD       insulin aspart (novoLOG) injection 0-9 Units  0-9 Units Subcutaneous TID WC Donnamae Jude, MD       memantine [facility]) tablet 10 mg  10 mg Oral BID Donnamae Jude, MD   10 mg at 12/20/20 0908   metoprolol tartrate (LOPRESSOR) injection 5 mg  5 mg Intravenous Q6H PRN Donnamae Jude, MD       multivitamin with minerals tablet 1 tablet  1 tablet Oral Daily Raiford Noble [place], DO   1 tablet at 12/20/20 0908   ondansetron (ZOFRAN) tablet 4 mg  4 mg Oral Q6H PRN Donnamae Jude, MD       Or   ondansetron  [facility]) injection 4 mg  4 mg Intravenous Q6H PRN Donnamae Jude, MD       polyethylene glycol (MIRALAX / GLYCOLAX) packet 17 g  17 g Oral Daily PRN Donnamae Jude, MD       potassium chloride SA (KLOR-CON) CR tablet 40 mEq  40 mEq Oral Once [place], Georgina Quint Latif, DO       sertraline (ZOLOFT) tablet 25 mg  25 mg Oral Daily Donnamae Jude, MD   25 mg at 12/20/20 0908   sodium chloride flush (NS) 0.9 % injection 3 mL  3 mL Intravenous Q12H Donnamae Jude, MD       sodium chloride flush (NS) 0.9 % injection 3 mL  3 mL Intravenous PRN Donnamae Jude, MD         Discharge Medications: Please see discharge summary for a list of discharge medications.  Relevant Imaging Results:  Relevant Lab Results:   Additional Information  [phone])  Keenya Matera, Juliann Pulse, RN

## 2020-12-20 NOTE — Progress Notes (Signed)
SLP Cancellation Note  Patient Details Name: Jessica Benton MRN: [id number] DOB: Jul 28, 1928   Cancelled treatment:       Reason Eval/Treat Not Completed: Other (comment);Patient declined, no reason specified (pt repositioned in bed with NT and then refused to try any intake despite multple options,  RN advised she was able to get pt to take medicine with applesauce and water, will continue efforts)  Kathleen Lime, MS [facility] SLP Acute Rehab Services Office [phone] Pager [phone]  Macario Golds 12/20/2020, 11:21 AM

## 2020-12-20 NOTE — NC FL2 (Signed)
[place] LEVEL OF CARE SCREENING TOOL     IDENTIFICATION  Patient Name: Jessica Benton Birthdate: 04-02-1929 Sex: female Admission Date (Current Location): 12/17/2020  [facility] and [place] Number:  Herbalist and Address:  [facility],  [place], [place]      Provider Number:    Attending Physician Name and Address:  Kerney Elbe, DO  Relative Name and Phone Number:  Maryanna Shape dtr [phone]    Current Level of Care: Hospital Recommended Level of Care: [place] Prior Approval Number:    Date Approved/Denied:   PASRR Number:  ([id number])  Discharge Plan: Other (Comment) (ALF)    Current Diagnoses: Patient Active Problem List   Diagnosis Date Noted   Protein-calorie malnutrition, severe 12/18/2020   Pyelonephritis 12/17/2020   Mixed Alzheimer's and vascular dementia ([place]) 06/13/2018   Gait disorder 06/13/2018    Orientation RESPIRATION BLADDER Height & Weight     Self  O2 Incontinent Weight: 47.5 kg Height:  '5\' 3"'$  (160 cm)  BEHAVIORAL SYMPTOMS/MOOD NEUROLOGICAL BOWEL NUTRITION STATUS      Incontinent Diet (2 gm na)  AMBULATORY STATUS COMMUNICATION OF NEEDS Skin   Total Care Verbally Normal                       Personal Care Assistance Level of Assistance  Bathing, Feeding, Dressing, Total care Bathing Assistance: Limited assistance Feeding assistance: Limited assistance Dressing Assistance: Maximum assistance Total Care Assistance: Maximum assistance   Functional Limitations Info  Sight, Hearing, Speech Sight Info: Adequate Hearing Info: Adequate Speech Info: Adequate    SPECIAL CARE FACTORS FREQUENCY  PT (By licensed PT), OT (By licensed OT)     PT Frequency:  (3x week) OT Frequency:  (3x week)            Contractures Contractures Info: Not present    Additional Factors Info  Code Status, Allergies, Psychotropic Code Status Info:  (Full) Allergies  Info:  (NKA) Psychotropic Info:  (aricept;namenda,& zoloft see MAR)         Current Medications (12/20/2020):  This is the current hospital active medication list Current Facility-Administered Medications  Medication Dose Route Frequency Provider Last Rate Last Admin   0.9 %  sodium chloride infusion  250 mL Intravenous PRN Donnamae Jude, MD 10 mL/hr at 12/17/20 2352 250 mL at 12/17/20 2352   acetaminophen (TYLENOL) tablet 650 mg  650 mg Oral Q6H PRN Donnamae Jude, MD       aspirin chewable tablet 81 mg  81 mg Oral Daily Donnamae Jude, MD   81 mg at 12/20/20 0908   azithromycin (ZITHROMAX) tablet 500 mg  500 mg Oral Daily Raiford Noble [place], DO   500 mg at 12/20/20 0908   cefTRIAXone (ROCEPHIN) 2 g in sodium chloride 0.9 % 100 mL IVPB  2 g Intravenous Q24H Donnamae Jude, MD 200 mL/hr at 12/19/20 1655 2 g at 12/19/20 1655   diclofenac Sodium (VOLTAREN) 1 % topical gel 4 g  4 g Topical Q8H PRN Donnamae Jude, MD       donepezil (ARICEPT) tablet 10 mg  10 mg Oral QHS Donnamae Jude, MD   10 mg at 12/19/20 2158   enoxaparin (LOVENOX) injection 30 mg  30 mg Subcutaneous Q24H Donnamae Jude, MD   30 mg at 12/20/20 0908   feeding supplement (ENSURE ENLIVE / ENSURE PLUS) liquid 237 mL  237 mL Oral BID BM Raiford Noble [place], DO   237 mL at 12/20/20 [id number]   HYDROcodone-acetaminophen (NORCO/VICODIN) 5-325 MG per tablet 1-2 tablet  1-2 tablet Oral Q4H PRN Donnamae Jude, MD       insulin aspart (novoLOG) injection 0-9 Units  0-9 Units Subcutaneous TID WC Donnamae Jude, MD       memantine [facility]) tablet 10 mg  10 mg Oral BID Donnamae Jude, MD   10 mg at 12/20/20 0908   metoprolol tartrate (LOPRESSOR) injection 5 mg  5 mg Intravenous Q6H PRN Donnamae Jude, MD       multivitamin with minerals tablet 1 tablet  1 tablet Oral Daily Raiford Noble [place], DO   1 tablet at 12/20/20 0908   ondansetron (ZOFRAN) tablet 4 mg  4 mg Oral Q6H PRN Donnamae Jude, MD       Or   ondansetron [facility]) injection 4 mg   4 mg Intravenous Q6H PRN Donnamae Jude, MD       polyethylene glycol (MIRALAX / GLYCOLAX) packet 17 g  17 g Oral Daily PRN Donnamae Jude, MD       potassium chloride SA (KLOR-CON) CR tablet 40 mEq  40 mEq Oral Once [place], Georgina Quint Latif, DO       sertraline (ZOLOFT) tablet 25 mg  25 mg Oral Daily Donnamae Jude, MD   25 mg at 12/20/20 0908   sodium chloride flush (NS) 0.9 % injection 3 mL  3 mL Intravenous Q12H Donnamae Jude, MD       sodium chloride flush (NS) 0.9 % injection 3 mL  3 mL Intravenous PRN Donnamae Jude, MD         Discharge Medications: Please see discharge summary for a list of discharge medications.  Relevant Imaging Results:  Relevant Lab Results:   Additional Information  [phone])  Kaylamarie Swickard, Juliann Pulse, RN

## 2020-12-20 NOTE — TOC Progression Note (Addendum)
Transition of Care [facility]) - Progression Note    Patient Details  Name: AYLINNE TURCZYN MRN: [id number] Date of Birth: 1929-05-09  Transition of Care [facility]) CM/SW Contact  Blain Hunsucker, Juliann Pulse, RN Phone Number: 12/20/2020, 9:52 AM  Clinical Narrative: Bed offers given to [facility] is waiting on palliative care meeting today-she asked about memory care-informed of recc for SNF & not memory care per PT note. Await choice.Received pasrr. Will need auth, & covid. 3:32p-Per note-dtr now declines SNF want return back to [facility] w/hospice, & hospital bed-faxed fl2 to [facility] fax#[phone]-await response.    Expected Discharge Plan: [place] Barriers to Discharge: Continued Medical Work up  Expected Discharge Plan and Services Expected Discharge Plan: [place]   Discharge Planning Services: CM Consult Post Acute Care Choice: Home Health (ALF) Living arrangements for the past 2 months: [place]                                       Social Determinants of Health (SDOH) Interventions    Readmission Risk Interventions No flowsheet data found.

## 2020-12-20 NOTE — NC FL2 (Signed)
[place] LEVEL OF CARE SCREENING TOOL     IDENTIFICATION  Patient Name: Jessica Benton Birthdate: 05/29/1928 Sex: female Admission Date (Current Location): 12/17/2020  [facility] and [place] Number:  Herbalist and Address:  [facility],  [place], [place]      Provider Number:    Attending Physician Name and Address:  Kerney Elbe, DO  Relative Name and Phone Number:  Maryanna Shape dtr [phone]    Current Level of Care: Hospital Recommended Level of Care: [place] Prior Approval Number:    Date Approved/Denied:   PASRR Number:  ([id number])  Discharge Plan: SNF    Current Diagnoses: Patient Active Problem List   Diagnosis Date Noted   Protein-calorie malnutrition, severe 12/18/2020   Pyelonephritis 12/17/2020   Mixed Alzheimer's and vascular dementia ([place]) 06/13/2018   Gait disorder 06/13/2018    Orientation RESPIRATION BLADDER Height & Weight     Self  O2 Incontinent Weight: 47.5 kg Height:  '5\' 3"'$  (160 cm)  BEHAVIORAL SYMPTOMS/MOOD NEUROLOGICAL BOWEL NUTRITION STATUS      Incontinent Diet (2 gm na)  AMBULATORY STATUS COMMUNICATION OF NEEDS Skin   Total Care Verbally Normal                       Personal Care Assistance Level of Assistance  Bathing, Feeding, Dressing, Total care Bathing Assistance: Limited assistance Feeding assistance: Limited assistance Dressing Assistance: Maximum assistance Total Care Assistance: Maximum assistance   Functional Limitations Info  Sight, Hearing, Speech Sight Info: Adequate Hearing Info: Adequate Speech Info: Adequate    SPECIAL CARE FACTORS FREQUENCY  PT (By licensed PT), OT (By licensed OT)     PT Frequency:  (3x week) OT Frequency:  (3x week)            Contractures Contractures Info: Not present    Additional Factors Info  Code Status, Allergies, Psychotropic Code Status Info:  (Full) Allergies Info:   (NKA) Psychotropic Info:  (aricept;namenda,& zoloft see MAR)         Current Medications (12/20/2020):  This is the current hospital active medication list Current Facility-Administered Medications  Medication Dose Route Frequency Provider Last Rate Last Admin   0.9 %  sodium chloride infusion  250 mL Intravenous PRN Donnamae Jude, MD 10 mL/hr at 12/17/20 2352 250 mL at 12/17/20 2352   acetaminophen (TYLENOL) tablet 650 mg  650 mg Oral Q6H PRN Donnamae Jude, MD       aspirin chewable tablet 81 mg  81 mg Oral Daily Donnamae Jude, MD   81 mg at 12/20/20 0908   azithromycin (ZITHROMAX) tablet 500 mg  500 mg Oral Daily Raiford Noble [place], DO   500 mg at 12/20/20 0908   cefTRIAXone (ROCEPHIN) 2 g in sodium chloride 0.9 % 100 mL IVPB  2 g Intravenous Q24H Donnamae Jude, MD 200 mL/hr at 12/19/20 1655 2 g at 12/19/20 1655   diclofenac Sodium (VOLTAREN) 1 % topical gel 4 g  4 g Topical Q8H PRN Donnamae Jude, MD       donepezil (ARICEPT) tablet 10 mg  10 mg Oral QHS Donnamae Jude, MD   10 mg at 12/19/20 2158   enoxaparin (LOVENOX) injection 30 mg  30 mg Subcutaneous Q24H Donnamae Jude, MD   30 mg at 12/20/20 0908   feeding supplement (ENSURE ENLIVE / ENSURE PLUS) liquid 237 mL  237  mL Oral BID BM Raiford Noble [place], DO   237 mL at 12/20/20 [id number]   HYDROcodone-acetaminophen (NORCO/VICODIN) 5-325 MG per tablet 1-2 tablet  1-2 tablet Oral Q4H PRN Donnamae Jude, MD       insulin aspart (novoLOG) injection 0-9 Units  0-9 Units Subcutaneous TID WC Donnamae Jude, MD       memantine [facility]) tablet 10 mg  10 mg Oral BID Donnamae Jude, MD   10 mg at 12/20/20 0908   metoprolol tartrate (LOPRESSOR) injection 5 mg  5 mg Intravenous Q6H PRN Donnamae Jude, MD       multivitamin with minerals tablet 1 tablet  1 tablet Oral Daily Raiford Noble [place], DO   1 tablet at 12/20/20 0908   ondansetron (ZOFRAN) tablet 4 mg  4 mg Oral Q6H PRN Donnamae Jude, MD       Or   ondansetron [facility]) injection 4 mg  4 mg  Intravenous Q6H PRN Donnamae Jude, MD       polyethylene glycol (MIRALAX / GLYCOLAX) packet 17 g  17 g Oral Daily PRN Donnamae Jude, MD       sertraline (ZOLOFT) tablet 25 mg  25 mg Oral Daily Donnamae Jude, MD   25 mg at 12/20/20 0908   sodium chloride flush (NS) 0.9 % injection 3 mL  3 mL Intravenous Q12H Donnamae Jude, MD       sodium chloride flush (NS) 0.9 % injection 3 mL  3 mL Intravenous PRN Donnamae Jude, MD         Discharge Medications: Please see discharge summary for a list of discharge medications.  Relevant Imaging Results:  Relevant Lab Results:   Additional Information  [phone])  Mirage Pfefferkorn, Juliann Pulse, RN

## 2020-12-20 NOTE — Progress Notes (Signed)
PROGRESS NOTE    Jessica Benton  [id number] DOB: [birth year] DOA: 12/17/2020 PCP: Bartholome Bill, MD   Brief Narrative:  The patient is a 87 year old elderly Caucasian female with a past medical history significant for but not limited to vascular dementia as well as other comorbidities who was recently placed in [place] the last month or so.  They are currently awaiting bed to get memory care section and per her daughter did not seem to be her usual self so she brought to the emergency room for further evaluation.  In the ED she is found to be tachycardic and tachypneic with a fever of 101.4.  She was given a working diagnosis of sepsis secondary to UTI as well as possible concomitant pneumonia and has been initiated on antibiotics.  She is also noted to be in acute hypoxic respiratory failure secondary to her likely pneumonia.  We will be obtaining SLP evaluation to check for aspiration and this is still pending. Repeat CXR this AM showed ". Progressive right base infiltrate consistent with pneumonia. Right pleural effusion again noted. Cardiomegaly again.  No pulmonary venous congestion."  She is still awaiting an SLP evaluation and Palliative Care Conversation.   Assessment & Plan:   Principal Problem:   Pyelonephritis Active Problems:   Mixed Alzheimer's and vascular dementia ([place])   Protein-calorie malnutrition, severe  Severe Sepsis 2/2 UTI/Pyelonephritis with concomitant Right sided pneumonia present on admission -Presented with a fever of 101.4, tachycardia of 111, elevated respiratory rate of 44, and a leukocytosis of 16.6 which is now improved to 12.2 now and some fatigue, lethargy, and confusion -Urinalysis done and showed a cloudy appearance with large leukocytes, positive nitrites, urine protein of 30, present amorphous crystals, many bacteria in the urine, 0-5 RBCs per high-power field, and 6-10 WBCs -Urine culture showing >100000 of Proteus Mirabilis and  40,000 of E Coli with Sensitivities Pending; -Blood culture x2 still pending but showing NGTD at 1 Day Still -Initial chest x-ray showed "New small to moderate bilateral pleural effusions and basilar airspace disease which could be due to atelectasis or pneumonia. Aortic Atherosclerosis." -Repeat chest x-ray this AM showed ". Progressive right base infiltrate consistent with pneumonia. Right pleural effusion again noted. Cardiomegaly again.  No pulmonary venous congestion." -Patient was wearing supplemental oxygen via nasal cannula yesterday and does not wear oxygen at home; She was placed on 4 Liters but was saturating 100%; Unclear if she was actually hypoxic given no documented SpO2 <88; Now off of Supplemental O2 via [place]  -Continue with antibiotic coverage with IV ceftriaxone and azithromycin and will continue  -Lactic acid levels 1.7 now trended down to 1.2  -Continue to monitor temperature curve as well as WBC -Check SLP evaluation to evaluate for aspiration and this is pending as she declined today but -Repeat CXR this AM showed "Low volumes with worsening bibasilar atelectasis and probable effusions" -PT OT recommending SNF   ? Acute Respiratory Failure with Hypoxia -In the setting of Above with Pneumonia  -SpO2: 93 % O2 Flow Rate (L/min): 4 L/min; Now off of Supplemental O2 and improved.  -Given adose of IV Lasix 40 mg x1 the day before yesterday -Continuous Pulse Oximetry and Maintain O2 Saturations >90% -Continue Supplemental O2 via [place] and Wean to Room Air -Obtaining SLP evaluation  -Repeat CXR in the AM and will need an Ambulatory Home O2 Screen  Severe malnutrition in the context of chronic illness -Nutritionist consulted for further evaluation and diet will be liberalized  from a heart healthy carb modified to 2 g sodium diet  -Estimated body mass index is 18.55 kg/m as calculated from the following:   Height as of this encounter: '5\' 3"'$  (1.6 m).   Weight as of this encounter:  47.5 kg. -Nutritionist is recommending Ensure Enlive twice daily as well as 30 mL of Prosource plus twice daily and 1 tab with multivitamin with minerals daily  Hypokalemia -Mild. K+ was 3.3 -Replete with po Kcl 40 mEQ x1 -Continue to Monitor and Replete as Necessary -Repeat CMP in the AM   Mixed Alzheimer's and Vascular Dementia, End-Stage Acute Confusion Superimposed on Chronic Dementia  -C/w Donepezil 10 mg p.o. nightly, memantine 10 mg p.o. twice daily, sertraline 25 mg p.o. daily -Palliative care consulted for further goals of care discussion and this is still pending   Hypoalbuminemia -Has Bilateral LE Swelling likely from 3rd Spacing -CXR showed Some small Bilateral Effusions -Given a Dose of IV Lasix as above -Albumin Level is now 2.3 -Continue to Monitor and may obtain a Pre-Albumin Level  DVT prophylaxis: Enoxaparin 30 mg subcu every 24 Code Status: FULL CODE  Family Communication: No family present at bedside  Disposition Plan: SNF   Status is: Inpatient  Remains inpatient appropriate because:Unsafe d/c plan, IV treatments appropriate due to intensity of illness or inability to take PO, and Inpatient level of care appropriate due to severity of illness  Dispo: The patient is from: Home              Anticipated d/c is to: SNF              Patient currently is not medically stable to d/c.   Difficult to place patient No  Consultants:  Palliative Care Medicine   Procedures: None  Antimicrobials:  Anti-infectives (From admission, onward)    Start     Dose/Rate Route Frequency Ordered Stop   12/18/20 1600  cefTRIAXone (ROCEPHIN) 2 g in sodium chloride 0.9 % 100 mL IVPB        2 g 200 mL/hr over 30 Minutes Intravenous Every 24 hours 12/17/20 2306 12/23/20 1559   12/18/20 1600  azithromycin (ZITHROMAX) 500 mg in sodium chloride 0.9 % 250 mL IVPB  Status:  Discontinued        500 mg 250 mL/hr over 60 Minutes Intravenous Every 24 hours 12/17/20 2306 12/18/20 1244    12/18/20 1330  azithromycin (ZITHROMAX) tablet 500 mg        500 mg Oral Daily 12/18/20 1244 12/22/20 0959   12/17/20 1545  azithromycin (ZITHROMAX) 500 mg in sodium chloride 0.9 % 250 mL IVPB        500 mg 250 mL/hr over 60 Minutes Intravenous  Once 12/17/20 1534 12/17/20 1759   12/17/20 1515  cefTRIAXone (ROCEPHIN) 1 g in sodium chloride 0.9 % 100 mL IVPB        1 g 200 mL/hr over 30 Minutes Intravenous  Once 12/17/20 1505 12/17/20 1643        Subjective: Seen and Examined at bedside and she is again resting and hard of hearing.  She still remains somnolent and little drowsy but knows who she is and when she was born and knows that she is in the hospital.  Awaiting a speech evaluation as well as palliative care goals of care discussion.  She has no other concerns or complaints at this time and wants to sleep and rest.  Objective: Vitals:   12/18/20 2008 12/19/20 [id number] 12/19/20 1930 12/20/20 [id number]  BP: (!) 151/88 (!) 119/96 (!) 157/93 (!) 160/84  Pulse: 87 78 85 82  Resp: '16 16 16 16  '$ Temp: 98 F (36.7 C) 97.9 F (36.6 C) 97.6 F (36.4 C) 98.2 F (36.8 C)  TempSrc:   Oral   SpO2: 99%  96% 93%  Weight:      Height:        Intake/Output Summary (Last 24 hours) at 12/20/2020 1314 Last data filed at 12/20/2020 0900 Gross per 24 hour  Intake 120 ml  Output 250 ml  Net -130 ml    Filed Weights   12/17/20 2324  Weight: 47.5 kg   Examination: Physical Exam:  Constitutional: Thin chronically ill-appearing elderly Caucasian female who is somnolent but arousable.  Has her eyes closed throughout the entire examination and wanted to rest Eyes: Lids are normal and she keeps her eyes closed still  ENMT: External Ears, Nose appear normal.  She is hard of hearing Neck: Appears normal, supple, no cervical masses, normal ROM, no appreciable thyromegaly; no JVD Respiratory: Diminished to auscultation bilaterally with coarse breath sounds, no wheezing, rales, rhonchi or crackles. Normal  respiratory effort and patient is not tachypenic. No accessory muscle use.  Not wearing any supplemental oxygen via nasal cannula Cardiovascular: RRR, no murmurs / rubs / gallops. S1 and S2 auscultated.  1+ lower extremity pitting edema Abdomen: Soft, non-tender, non-distended. Bowel sounds positive.  GU: Deferred. Musculoskeletal: No clubbing / cyanosis of digits/nails. No joint deformity upper and lower extremities.  Skin: No rashes, lesions, ulcers on limited skin evaluation. No induration; Warm and dry.  Neurologic: CN 2-12 grossly intact with no focal deficits. Romberg sign and cerebellar reflexes not assessed.  Psychiatric: Impaired judgment and insight.  Somnolent and drowsy but she does communicate and easily arousable and is oriented x2. Normal mood and appropriate affect.   Data Reviewed: I have personally reviewed following labs and imaging studies  CBC: Recent Labs  Lab 12/17/20 1352 12/18/20 0459 12/19/20 0848 12/20/20 1013  WBC 16.6* 14.3* 13.0* 12.2*  NEUTROABS 13.6*  --  10.0* 9.4*  HGB 13.5 12.1 12.9 12.7  HCT 42.8 38.6 41.5 41.9  MCV 96.2 98.5 96.7 99.5  PLT 278 240 285 [id number]    Basic Metabolic Panel: Recent Labs  Lab 12/17/20 1352 12/18/20 0459 12/19/20 0848 12/20/20 1013  NA 140 141 143 140  K 4.1 4.0 3.8 3.3*  CL 105 106 100 101  CO2 '26 26 30 28  '$ GLUCOSE 150* 118* 110* 115*  BUN 25* 19 25* 25*  CREATININE 0.98 0.77 0.99 0.81  CALCIUM 8.4* 8.7* 8.8* 8.6*  MG  --   --  2.3 2.2  PHOS  --   --  4.4 3.2    GFR: Estimated Creatinine Clearance: 34.6 mL/min (by C-G formula based on SCr of 0.81 mg/dL). Liver Function Tests: Recent Labs  Lab 12/17/20 1352 12/18/20 0459 12/19/20 0848 12/20/20 1013  AST '28 26 26 23  '$ ALT '18 21 22 20  '$ ALKPHOS 116 100 116 102  BILITOT 1.0 0.7 0.7 0.9  PROT 6.0* 5.2* 6.0* 5.4*  ALBUMIN 2.7* 2.3* 2.6* 2.3*    No results for input(s): LIPASE, AMYLASE in the last 168 hours. No results for input(s): AMMONIA in the last  168 hours. Coagulation Profile: Recent Labs  Lab 12/17/20 1352 12/17/20 2328  INR 1.2 1.1    Cardiac Enzymes: No results for input(s): CKTOTAL, CKMB, CKMBINDEX, TROPONINI in the last 168 hours. BNP (last 3 results) No results for input(s):  PROBNP in the last 8760 hours. HbA1C: Recent Labs    12/17/20 2328  HGBA1C 5.9*    CBG: Recent Labs  Lab 12/19/20 1059 12/19/20 1649 12/19/20 2158 12/20/20 0724 12/20/20 1132  GLUCAP 97 105* 102* 87 100*    Lipid Profile: No results for input(s): CHOL, HDL, LDLCALC, TRIG, CHOLHDL, LDLDIRECT in the last 72 hours. Thyroid Function Tests: No results for input(s): TSH, T4TOTAL, FREET4, T3FREE, THYROIDAB in the last 72 hours. Anemia Panel: No results for input(s): VITAMINB12, FOLATE, FERRITIN, TIBC, IRON, RETICCTPCT in the last 72 hours. Sepsis Labs: Recent Labs  Lab 12/17/20 1352 12/17/20 2311  LATICACIDVEN 1.7 1.2      RN Pressure Injury Documentation:     Estimated body mass index is 18.55 kg/m as calculated from the following:   Height as of this encounter: '5\' 3"'$  (1.6 m).   Weight as of this encounter: 47.5 kg.  Malnutrition Type: Nutrition Problem: Severe Malnutrition Etiology: chronic illness (dementia) Malnutrition Characteristics: Signs/Symptoms: moderate fat depletion, moderate muscle depletion, severe fat depletion, severe muscle depletion Nutrition Interventions: Interventions: Liberalize Diet, Ensure Enlive (each supplement provides 350kcal and 20 grams of protein), MVI, Magic cup  Radiology Studies: DG CHEST PORT 1 VIEW  Result Date: 12/20/2020 CLINICAL DATA:  Sob EXAM: PORTABLE CHEST - 1 VIEW COMPARISON:  the previous day's study FINDINGS: Low lung volumes. Worsening consolidation/atelectasis at the right lung base. Question worsening left pleural effusion. Probable small right effusion as well. Heart size and mediastinal contours are within normal limits. Aortic Atherosclerosis (ICD10-170.0). No  pneumothorax. Visualized bones unremarkable.  Cholecystectomy clips. IMPRESSION: Low volumes with worsening bibasilar atelectasis and probable effusions Electronically Signed   By: Lucrezia Europe M.D.   On: 12/20/2020 07:49   DG CHEST PORT 1 VIEW  Result Date: 12/19/2020 CLINICAL DATA:  Shortness of breath. EXAM: PORTABLE CHEST 1 VIEW COMPARISON:  12/18/2020. FINDINGS: Stable cardiomegaly. No pulmonary venous congestion. Mitral annular calcification. Progressive right base infiltrate. Small right pleural effusion. No pneumothorax. Surgical clips right upper quadrant. IMPRESSION: 1. Progressive right base infiltrate consistent with pneumonia. Right pleural effusion again noted. 2.  Cardiomegaly again.  No pulmonary venous congestion. Electronically Signed   By: Marcello Moores  Register   On: 12/19/2020 10:15     Scheduled Meds:  aspirin  81 mg Oral Daily   azithromycin  500 mg Oral Daily   donepezil  10 mg Oral QHS   enoxaparin (LOVENOX) injection  30 mg Subcutaneous Q24H   feeding supplement  237 mL Oral BID BM   insulin aspart  0-9 Units Subcutaneous TID WC   memantine  10 mg Oral BID   multivitamin with minerals  1 tablet Oral Daily   sertraline  25 mg Oral Daily   sodium chloride flush  3 mL Intravenous Q12H   Continuous Infusions:  sodium chloride 250 mL (12/17/20 2352)   cefTRIAXone (ROCEPHIN)  IV 2 g (12/19/20 1655)    LOS: 3 days   Kerney Elbe, DO [facility] PAGER is on AMION  If 7PM-7AM, please contact night-coverage [email or web address]

## 2020-12-20 NOTE — TOC Progression Note (Signed)
Transition of Care [facility]) - Progression Note    Patient Details  Name: Jessica Benton MRN: [id number] Date of Birth: 28-Oct-1928  Transition of [facility]) CM/SW Contact  Xzaiver Vayda, Juliann Pulse, RN Phone Number: 12/20/2020, 5:14 PM  Clinical Narrative:  faxed fl2 to [facility] for ALF-rep Threasa Beards will review. [facility] care rep Latanya Presser will eval & talk to [facility] is aware of hospital bed. Continue to monitor.    Expected Discharge Plan: Home w Hospice Care Barriers to Discharge: Continued Medical Work up  Expected Discharge Plan and Services Expected Discharge Plan: [place]   Discharge Planning Services: CM Consult Post Acute Care Choice: Home Health (ALF) Living arrangements for the past 2 months: [place]                                       Social Determinants of Health (SDOH) Interventions    Readmission Risk Interventions No flowsheet data found.

## 2020-12-20 NOTE — Plan of Care (Signed)

## 2020-12-21 ENCOUNTER — Inpatient Hospital Stay (HOSPITAL_COMMUNITY): Payer: Medicare PPO

## 2020-12-21 DIAGNOSIS — J189 Pneumonia, unspecified organism: Secondary | ICD-10-CM | POA: Diagnosis not present

## 2020-12-21 DIAGNOSIS — G309 Alzheimer's disease, unspecified: Secondary | ICD-10-CM | POA: Diagnosis not present

## 2020-12-21 DIAGNOSIS — N12 Tubulo-interstitial nephritis, not specified as acute or chronic: Secondary | ICD-10-CM | POA: Diagnosis not present

## 2020-12-21 DIAGNOSIS — E43 Unspecified severe protein-calorie malnutrition: Secondary | ICD-10-CM | POA: Diagnosis not present

## 2020-12-21 LAB — CBC WITH DIFFERENTIAL/PLATELET
Abs Immature Granulocytes: 0.49 10*3/uL — ABNORMAL HIGH (ref 0.00–0.07)
Basophils Absolute: 0.1 10*3/uL (ref 0.0–0.1)
Basophils Relative: 1 %
Eosinophils Absolute: 0.2 10*3/uL (ref 0.0–0.5)
Eosinophils Relative: 2 %
HCT: 40.2 % (ref 36.0–46.0)
Hemoglobin: 12.7 g/dL (ref 12.0–15.0)
Immature Granulocytes: 4 %
Lymphocytes Relative: 9 %
Lymphs Abs: 1.2 10*3/uL (ref 0.7–4.0)
MCH: 30.4 pg (ref 26.0–34.0)
MCHC: 31.6 g/dL (ref 30.0–36.0)
MCV: 96.2 fL (ref 80.0–100.0)
Monocytes Absolute: 1.3 10*3/uL — ABNORMAL HIGH (ref 0.1–1.0)
Monocytes Relative: 10 %
Neutro Abs: 9.8 10*3/uL — ABNORMAL HIGH (ref 1.7–7.7)
Neutrophils Relative %: 74 %
Platelets: 301 10*3/uL (ref 150–400)
RBC: 4.18 MIL/uL (ref 3.87–5.11)
RDW: 13.4 % (ref 11.5–15.5)
WBC: 13.1 10*3/uL — ABNORMAL HIGH (ref 4.0–10.5)
nRBC: 0 % (ref 0.0–0.2)

## 2020-12-21 LAB — COMPREHENSIVE METABOLIC PANEL
ALT: 22 U/L (ref 0–44)
AST: 28 U/L (ref 15–41)
Albumin: 2.4 g/dL — ABNORMAL LOW (ref 3.5–5.0)
Alkaline Phosphatase: 107 U/L (ref 38–126)
Anion gap: 9 (ref 5–15)
BUN: 23 mg/dL (ref 8–23)
CO2: 27 mmol/L (ref 22–32)
Calcium: 8.3 mg/dL — ABNORMAL LOW (ref 8.9–10.3)
Chloride: 102 mmol/L (ref 98–111)
Creatinine, Ser: 0.76 mg/dL (ref 0.44–1.00)
GFR, Estimated: >60 mL/min (ref >60)
Glucose, Bld: 104 mg/dL — ABNORMAL HIGH (ref 70–99)
Potassium: 3.1 mmol/L — ABNORMAL LOW (ref 3.5–5.1)
Sodium: 138 mmol/L (ref 135–145)
Total Bilirubin: 0.9 mg/dL (ref 0.3–1.2)
Total Protein: 5.4 g/dL — ABNORMAL LOW (ref 6.5–8.1)

## 2020-12-21 LAB — GLUCOSE, CAPILLARY
Glucose-Capillary: 92 mg/dL (ref 70–99)
Glucose-Capillary: 124 mg/dL — ABNORMAL HIGH (ref 70–99)
Glucose-Capillary: 102 mg/dL — ABNORMAL HIGH (ref 70–99)
Glucose-Capillary: 105 mg/dL — ABNORMAL HIGH (ref 70–99)

## 2020-12-21 LAB — PHOSPHORUS: Phosphorus: 2.9 mg/dL (ref 2.5–4.6)

## 2020-12-21 LAB — MAGNESIUM: Magnesium: 2.1 mg/dL (ref 1.7–2.4)

## 2020-12-21 MED ORDER — POTASSIUM CHLORIDE 10 MEQ/100ML IV SOLN
10.0000 meq | INTRAVENOUS | Status: AC
  Administered 2020-12-21 (×4): 10 meq via INTRAVENOUS
  Filled 2020-12-21 (×4): qty 100

## 2020-12-21 NOTE — Evaluation (Signed)
Clinical/Bedside Swallow Evaluation Patient Details  Name: Jessica Benton MRN: [id number] Date of Birth: Feb 27, 1929  Today's Date: 12/21/2020 Time: SLP Start Time (ACUTE ONLY): 1150 SLP Stop Time (ACUTE ONLY): 1205 SLP Time Calculation (min) (ACUTE ONLY): 15 min  Past Medical History:  Past Medical History:  Diagnosis Date   Cancer [facility])    Cataract    Cerebrovascular disease 06/13/2018   Gait disorder 06/13/2018   Osteoporosis    Past Surgical History:  Past Surgical History:  Procedure Laterality Date   APPENDECTOMY     BREAST SURGERY     EYE SURGERY     PARTIAL HYSTERECTOMY     HPI:  Patient is a 87 y.o. female with PMH: vascular dementia, who was recently placed in [place] [facility] in past month or so and awaiting bed in memory care section. She was admitted 12/17/20 for Sepsis 2/2 UTI/Pyelonephritis with likely concomitant Right sided pneumonia present on admission.   Assessment / Plan / Recommendation Clinical Impression  Patient presents with what appears to be a mild oral dysphagia consisting of prolonged mastication and oral transit/clearance of regular solids PO's. Pharyngeal phase of swallow appeared [facility] with swallow initiation appearing timely and without overt s/s aspiration or penetration or change in vitals or vocal quality. SLP is recommending to continue with regular texture solids, thin liquids and plan to complete at least one diet check while patient is admitted in hospital to ensure diet toleration. SLP Visit Diagnosis: Dysphagia, oral phase (R13.11)    Aspiration Risk  Mild aspiration risk    Diet Recommendation Regular;Thin liquid   Liquid Administration via: Cup;Straw Medication Administration: Whole meds with puree Supervision: Full supervision/cueing for compensatory strategies;Staff to assist with self feeding Compensations: Small sips/bites;Slow rate Postural Changes: Seated upright at 90 degrees    Other  Recommendations Oral Care  Recommendations: Oral care BID;Staff/trained caregiver to provide oral care   Follow up Recommendations Skilled Nursing facility;24 hour supervision/assistance      Frequency and Duration min 1 x/week  1 week       Prognosis Prognosis for Safe Diet Advancement: Good      Swallow Study   General Date of Onset: 12/17/20 HPI: Patient is a 87 y.o. female with PMH: vascular dementia, who was recently placed in [place] [facility] in past month or so and awaiting bed in memory care section. She was admitted 12/17/20 for Sepsis 2/2 UTI/Pyelonephritis with likely concomitant Right sided pneumonia present on admission. Type of Study: Bedside Swallow Evaluation Previous Swallow Assessment: none found Diet Prior to this Study: Regular;Thin liquids Temperature Spikes Noted: No Respiratory Status: Room air History of Recent Intubation: No Behavior/Cognition: Cooperative;Lethargic/Drowsy Oral Cavity Assessment: Within Functional Limits Oral Care Completed by SLP: No Oral Cavity - Dentition: Adequate natural dentition Self-Feeding Abilities: Total assist Patient Positioning: Upright in bed Baseline Vocal Quality: Low vocal intensity Volitional Swallow: Unable to elicit    Oral/Motor/Sensory Function Overall Oral Motor/Sensory Function: Other (comment) (difficult to fully assess as patient only participating minimally)   Ice Chips     Thin Liquid Thin Liquid: Within functional limits Presentation: Straw    Nectar Thick     Honey Thick     Puree Puree: Within functional limits   Solid     Solid: Impaired Oral Phase Impairments: Impaired mastication Oral Phase Functional Implications: Prolonged oral transit     Sonia Baller, MA, CCC-SLP Speech Therapy

## 2020-12-21 NOTE — TOC Progression Note (Addendum)
Transition of Care [facility]) - Progression Note    Patient Details  Name: Jessica Benton MRN: [id number] Date of Birth: [birth year]  Transition of Care [facility]) CM/SW Contact  Hanalei Glace, Juliann Pulse, RN Phone Number: 12/21/2020, 10:20 AM  Clinical Narrative: [place] to [facility] who will f/u on fl2 that was sent yesterday to confirm if able to accept back-patient will be evaluated for hospice services, & delivery of hospital bed by [facility]-rep Chrislyn following-she is awaiting confirmation of [place] accepting back prior ordering hospital bed,,new addition is a w/c.   11:37a-spoke to [facility] rep charity-they have received signed fl2 with all info needed until d/c summary, & d/c meds-they cannot accept over weekend. [facility] rep [place] following for ordering hospital bed,& w/c if accepted.Continue to monitor.DNR.PTAR @ d/c.    Expected Discharge Plan: Home w Hospice Care Barriers to Discharge: Continued Medical Work up  Expected Discharge Plan and Services Expected Discharge Plan: [place]   Discharge Planning Services: CM Consult Post Acute Care Choice: Home Health (ALF) Living arrangements for the past 2 months: [place]                                       Social Determinants of Health (SDOH) Interventions    Readmission Risk Interventions No flowsheet data found.

## 2020-12-21 NOTE — Progress Notes (Signed)
Manufacturing engineer [facility]) Hospital Liaison: RN note     Notified by Transition of Care Manger of patient/family request for [facility] services at home after discharge. Chart and patient information have been reviewed by [facility] physician. Hospice eligibility confirmed.  Writer spoke with daughter Earnest Bailey and son Camila Li to continue education related to hospice philosophy, services and team approach to care.  Both [place] and [place] verbalized understanding of information given.    Please send signed and completed DNR form home with patient/family.   Please provide prescriptions for mediations at discharge to ensure comfort until patient can be admitted onto hospice services.   DME needs have been discussed.  Patient/family requests the following DME for delivery to the home:  hospital bed and wheelchair/transport chair.  This liaison has contact DME provider to arrange delivery to [facility] once pt has been deemed eligible by [facility] MD. Address has been verified with [facility]: [address], [address].  Please do not hesitate to call with questions.   Thank you for the opportunity to participate in this patient's care.  Domenic Moras, BSN, RN       [place] (listed on AMION under [phone]  [phone] (24h on call)

## 2020-12-21 NOTE — Progress Notes (Signed)
PROGRESS NOTE    Jessica Benton  IOM:[id number] DOB: 1928-11-07 DOA: 12/17/2020 PCP: Bartholome Bill, MD   Brief Narrative:  The patient is a 87 year old elderly Caucasian female with a past medical history significant for but not limited to vascular dementia as well as other comorbidities who was recently placed in [place] the last month or so.  They are currently awaiting bed to get memory care section and per her daughter did not seem to be her usual self so she brought to the emergency room for further evaluation.  In the ED she is found to be tachycardic and tachypneic with a fever of 101.4.  She was given a working diagnosis of sepsis secondary to UTI as well as possible concomitant pneumonia and has been initiated on antibiotics.  She is also noted to be in acute hypoxic respiratory failure secondary to her likely pneumonia.  We will be obtaining SLP evaluation to check for aspiration and this is still pending. Repeat CXR this AM showed ". Progressive right base infiltrate consistent with pneumonia. Right pleural effusion again noted. Cardiomegaly again.  No pulmonary venous congestion."  She is still awaiting an SLP evaluation and Palliative Care Conversation.  Palliative care met with the patient and her CODE STATUS was changed to DNR and the plan is for patient to go back to the ALF with hospice his family does not want SNF.  Assessment & Plan:   Principal Problem:   Pyelonephritis Active Problems:   Mixed Alzheimer's and vascular dementia ([place])   Protein-calorie malnutrition, severe  Severe Sepsis 2/2 UTI/Pyelonephritis with concomitant Right sided pneumonia present on admission -Presented with a fever of 101.4, tachycardia of 111, elevated respiratory rate of 44, and a leukocytosis of 16.6 which is now improved to 12.2 yesterday but has worsened slightly and is now 13.1 today and some fatigue, lethargy, and confusion -Urinalysis done and showed a cloudy appearance  with large leukocytes, positive nitrites, urine protein of 30, present amorphous crystals, many bacteria in the urine, 0-5 RBCs per high-power field, and 6-10 WBCs -Urine culture showing >100000 of Proteus Mirabilis and 40,000 of E Coli with Sensitivities that are mostly pansensitive but the only one that is resistant is a Proteus mirabilis to the nitrofurantoin; -Blood culture x2 still pending but showing NGTD at 1 Day Still have not been updated yet -Initial chest x-ray showed "New small to moderate bilateral pleural effusions and basilar airspace disease which could be due to atelectasis or pneumonia. Aortic Atherosclerosis." -Repeat chest the day before yesterday showed ". Progressive right base infiltrate consistent with pneumonia. Right pleural effusion again noted. Cardiomegaly again.  No pulmonary venous congestion." -Patient was wearing supplemental oxygen via nasal cannula yesterday and does not wear oxygen at home; She was placed on 4 Liters but was saturating 100%; Unclear if she was actually hypoxic given no documented SpO2 <88; Now off of Supplemental O2 via [place]  -Continue with antibiotic coverage with IV ceftriaxone and azithromycin and will continue  -Lactic acid levels 1.7 now trended down to 1.2  -Continue to monitor temperature curve as well as WBC -Check SLP evaluation to evaluate for aspiration and this is pending as she declined today but -Repeat CXR yesterday AM showed "Low volumes with worsening bibasilar atelectasis and probable effusions" and today's chest x-ray showed no change in exam -PT OT recommending SNF but after goals of care discussion family has elected for the patient to go back to her ALF with hospice services and unfortunately will  not be able to discharge this weekend  ? Acute Respiratory Failure with Hypoxia -In the setting of Above with Pneumonia  -SpO2: 92 % O2 Flow Rate (L/min): 4 L/min; Now off of Supplemental O2 and improved.  -Given adose of IV Lasix 40  mg x1 the day before yesterday -Continuous Pulse Oximetry and Maintain O2 Saturations >90% -Continue Supplemental O2 via [place] and [place] to Room Air -Obtaining SLP evaluation  -Continue monitor respiratory status carefully and will need ambulatory home O2 screen prior to discharging back to her ALF  Severe malnutrition in the context of chronic illness -Nutritionist consulted for further evaluation and diet will be liberalized from a heart healthy carb modified to 2 g sodium diet  -Estimated body mass index is 18.55 kg/m as calculated from the following:   Height as of this encounter: $RemoveBeforeD'5\' 3"'EyELBrYrQubCVS$  (1.6 m).   Weight as of this encounter: 47.5 kg. -Nutritionist is recommending Ensure Enlive twice daily as well as 30 mL of Prosource plus twice daily and 1 tab with multivitamin with minerals daily  Hypokalemia -Mild. K+ was 3.1 -Replete with IV KCl 40 mEq -Continue to Monitor and Replete as Necessary -Repeat CMP in the AM   Mixed Alzheimer's and Vascular Dementia, End-Stage Acute Confusion Superimposed on Chronic Dementia  -C/w Donepezil 10 mg p.o. nightly, memantine 10 mg p.o. twice daily, sertraline 25 mg p.o. daily -Palliative care consulted for further goals of care discussion and this is still pending   Hypoalbuminemia -Has Bilateral LE Swelling likely from 3rd Spacing -CXR showed Some small Bilateral Effusions -Given a Dose of IV Lasix as above -Albumin Level is now 2.4 -Continue to Monitor and may obtain a Pre-Albumin Level  DVT prophylaxis: Enoxaparin 30 mg subcu every 24 Code Status: FULL CODE now changed to DNR after goals of care discussion Family Communication: No family present at bedside  Disposition Plan: SNF initially but now will go back to her ALF with hospice services  Status is: Inpatient  Remains inpatient appropriate because:Unsafe d/c plan, IV treatments appropriate due to intensity of illness or inability to take PO, and Inpatient level of care appropriate due to  severity of illness  Dispo: The patient is from: Home              Anticipated d/c is to: SNF              Patient currently is not medically stable to d/c.   Difficult to place patient No  Consultants:  Palliative Care Medicine   Procedures: None  Antimicrobials:  Anti-infectives (From admission, onward)    Start     Dose/Rate Route Frequency Ordered Stop   12/18/20 1600  cefTRIAXone (ROCEPHIN) 2 g in sodium chloride 0.9 % 100 mL IVPB        2 g 200 mL/hr over 30 Minutes Intravenous Every 24 hours 12/17/20 2306 12/23/20 1559   12/18/20 1600  azithromycin (ZITHROMAX) 500 mg in sodium chloride 0.9 % 250 mL IVPB  Status:  Discontinued        500 mg 250 mL/hr over 60 Minutes Intravenous Every 24 hours 12/17/20 2306 12/18/20 1244   12/18/20 1330  azithromycin (ZITHROMAX) tablet 500 mg        500 mg Oral Daily 12/18/20 1244 12/21/20 0818   12/17/20 1545  azithromycin (ZITHROMAX) 500 mg in sodium chloride 0.9 % 250 mL IVPB        500 mg 250 mL/hr over 60 Minutes Intravenous  Once 12/17/20 1534 12/17/20 1759  12/17/20 1515  cefTRIAXone (ROCEPHIN) 1 g in sodium chloride 0.9 % 100 mL IVPB        1 g 200 mL/hr over 30 Minutes Intravenous  Once 12/17/20 1505 12/17/20 1643        Subjective: Seen and Examined at bedside and she is again resting little bit more interactive today.  Her eyes closed again.  Little bit more comfortable today.  No chest pain or shortness breath.  No lightheadedness or dizziness.  No other concerns or complaints at this time.  Objective: Vitals:   12/20/20 0625 12/20/20 1400 12/20/20 2059 12/21/20 0442  BP: (!) 160/84 (!) 148/72 (!) 154/87 137/72  Pulse: 82 100 87 84  Resp: $Remo'16 20 20 18  'rbxgF$ Temp: 98.2 F (36.8 C) 98.3 F (36.8 C) 98.8 F (37.1 C) 98.1 F (36.7 C)  TempSrc:  Axillary Oral Oral  SpO2: 93% 93% 94% 92%  Weight:      Height:        Intake/Output Summary (Last 24 hours) at 12/21/2020 1326 Last data filed at 12/21/2020 0960 Gross per 24 hour   Intake 580.18 ml  Output 800 ml  Net -219.82 ml    Filed Weights   12/17/20 2324  Weight: 47.5 kg   Examination: Physical Exam:  Constitutional: Patient is a thin chronically ill-appearing elderly Caucasian female who is in no acute distress but appears somnolent again Eyes: Lids are normal she continues to keep her eyes closed ENMT: External Ears, Nose appear normal.  She is hard of hearing Neck: Appears normal, supple, no cervical masses, normal ROM, no appreciable thyromegaly; no JVD Respiratory: Diminished to auscultation bilaterally, no wheezing, rales, rhonchi or crackles. Normal respiratory effort and patient is not tachypenic. No accessory muscle use.  Unlabored breathing Cardiovascular: RRR, no murmurs / rubs / gallops. S1 and S2 auscultated.  1+ lower extremity pitting edema Abdomen: Soft, non-tender, non-distended. Bowel sounds positive.  GU: Deferred. Musculoskeletal: No clubbing / cyanosis of digits/nails. No joint deformity upper and lower extremities.  Skin: No rashes, lesions, ulcers on limited skin evaluation. No induration; Warm and dry.  Neurologic: CN 2-12 grossly intact with no focal deficits. Romberg sign and cerebellar reflexes not assessed.  Psychiatric: Impaired judgment and insight.  Continues to remain somnolent and drowsy but easily arousable and oriented to herself and knows that she is in the hospital. Normal mood and appropriate affect.   Data Reviewed: I have personally reviewed following labs and imaging studies  CBC: Recent Labs  Lab 12/17/20 1352 12/18/20 0459 12/19/20 0848 12/20/20 1013 12/21/20 0541  WBC 16.6* 14.3* 13.0* 12.2* 13.1*  NEUTROABS 13.6*  --  10.0* 9.4* 9.8*  HGB 13.5 12.1 12.9 12.7 12.7  HCT 42.8 38.6 41.5 41.9 40.2  MCV 96.2 98.5 96.7 99.5 96.2  PLT 278 240 285 272 454    Basic Metabolic Panel: Recent Labs  Lab 12/17/20 1352 12/18/20 0459 12/19/20 0848 12/20/20 1013 12/21/20 0541  NA 140 141 143 140 138  K 4.1  4.0 3.8 3.3* 3.1*  CL 105 106 100 101 102  CO2 $Re'26 26 30 28 27  'YYp$ GLUCOSE 150* 118* 110* 115* 104*  BUN 25* 19 25* 25* 23  CREATININE 0.98 0.77 0.99 0.81 0.76  CALCIUM 8.4* 8.7* 8.8* 8.6* 8.3*  MG  --   --  2.3 2.2 2.1  PHOS  --   --  4.4 3.2 2.9    GFR: Estimated Creatinine Clearance: 35 mL/min (by C-G formula based on SCr of 0.76 mg/dL).  Liver Function Tests: Recent Labs  Lab 12/17/20 1352 12/18/20 0459 12/19/20 0848 12/20/20 1013 12/21/20 0541  AST $Re'28 26 26 23 28  'bcF$ ALT $R'18 21 22 20 22  'Wz$ ALKPHOS 116 100 116 102 107  BILITOT 1.0 0.7 0.7 0.9 0.9  PROT 6.0* 5.2* 6.0* 5.4* 5.4*  ALBUMIN 2.7* 2.3* 2.6* 2.3* 2.4*    No results for input(s): LIPASE, AMYLASE in the last 168 hours. No results for input(s): AMMONIA in the last 168 hours. Coagulation Profile: Recent Labs  Lab 12/17/20 1352 12/17/20 2328  INR 1.2 1.1    Cardiac Enzymes: No results for input(s): CKTOTAL, CKMB, CKMBINDEX, TROPONINI in the last 168 hours. BNP (last 3 results) No results for input(s): PROBNP in the last 8760 hours. HbA1C: No results for input(s): HGBA1C in the last 72 hours.  CBG: Recent Labs  Lab 12/20/20 1132 12/20/20 1650 12/20/20 2101 12/21/20 0739 12/21/20 1206  GLUCAP 100* 115* 97 92 124*    Lipid Profile: No results for input(s): CHOL, HDL, LDLCALC, TRIG, CHOLHDL, LDLDIRECT in the last 72 hours. Thyroid Function Tests: No results for input(s): TSH, T4TOTAL, FREET4, T3FREE, THYROIDAB in the last 72 hours. Anemia Panel: No results for input(s): VITAMINB12, FOLATE, FERRITIN, TIBC, IRON, RETICCTPCT in the last 72 hours. Sepsis Labs: Recent Labs  Lab 12/17/20 1352 12/17/20 2311  LATICACIDVEN 1.7 1.2      RN Pressure Injury Documentation:     Estimated body mass index is 18.55 kg/m as calculated from the following:   Height as of this encounter: $RemoveBeforeD'5\' 3"'[id number]$  (1.6 m).   Weight as of this encounter: 47.5 kg.  Malnutrition Type: Nutrition Problem: Severe Malnutrition Etiology:  chronic illness (dementia) Malnutrition Characteristics: Signs/Symptoms: moderate fat depletion, moderate muscle depletion, severe fat depletion, severe muscle depletion Nutrition Interventions: Interventions: Liberalize Diet, Ensure Enlive (each supplement provides 350kcal and 20 grams of protein), MVI, Magic cup  Radiology Studies: DG CHEST PORT 1 VIEW  Result Date: 12/21/2020 CLINICAL DATA:  Short of breath. EXAM: PORTABLE CHEST 1 VIEW COMPARISON:  12/20/2020 FINDINGS: Stable cardiomediastinal contours. Aortic atherosclerosis. Small bilateral pleural effusions are unchanged from previous exam. There is diminished aeration within both lung bases which may reflect atelectasis and or airspace disease. This is also unchanged when compared with the previous exam. IMPRESSION: No change from previous exam. Electronically Signed   By: Kerby Moors M.D.   On: 12/21/2020 08:08   DG CHEST PORT 1 VIEW  Result Date: 12/20/2020 CLINICAL DATA:  Sob EXAM: PORTABLE CHEST - 1 VIEW COMPARISON:  the previous day's study FINDINGS: Low lung volumes. Worsening consolidation/atelectasis at the right lung base. Question worsening left pleural effusion. Probable small right effusion as well. Heart size and mediastinal contours are within normal limits. Aortic Atherosclerosis (ICD10-170.0). No pneumothorax. Visualized bones unremarkable.  Cholecystectomy clips. IMPRESSION: Low volumes with worsening bibasilar atelectasis and probable effusions Electronically Signed   By: Lucrezia Europe M.D.   On: 12/20/2020 07:49     Scheduled Meds:  aspirin  81 mg Oral Daily   donepezil  10 mg Oral QHS   enoxaparin (LOVENOX) injection  30 mg Subcutaneous Q24H   feeding supplement  237 mL Oral BID BM   insulin aspart  0-9 Units Subcutaneous TID WC   memantine  10 mg Oral BID   multivitamin with minerals  1 tablet Oral Daily   potassium chloride  40 mEq Oral Once   sertraline  25 mg Oral Daily   sodium chloride flush  3 mL Intravenous  Q12H  Continuous Infusions:  sodium chloride 250 mL (12/20/20 1845)   cefTRIAXone (ROCEPHIN)  IV 2 g (12/20/20 1847)    LOS: 4 days   Kerney Elbe, DO [facility] PAGER is on [place]  If 7PM-7AM, please contact night-coverage [email or web address]

## 2020-12-21 NOTE — Progress Notes (Signed)
Assumed care of patient from previous nurse. Agree with previous nurses assessment. Will continue to monitor.  

## 2020-12-22 DIAGNOSIS — N12 Tubulo-interstitial nephritis, not specified as acute or chronic: Secondary | ICD-10-CM | POA: Diagnosis not present

## 2020-12-22 DIAGNOSIS — J189 Pneumonia, unspecified organism: Secondary | ICD-10-CM | POA: Diagnosis not present

## 2020-12-22 DIAGNOSIS — G309 Alzheimer's disease, unspecified: Secondary | ICD-10-CM | POA: Diagnosis not present

## 2020-12-22 DIAGNOSIS — E43 Unspecified severe protein-calorie malnutrition: Secondary | ICD-10-CM | POA: Diagnosis not present

## 2020-12-22 LAB — CBC WITH DIFFERENTIAL/PLATELET
Abs Immature Granulocytes: 0.66 10*3/uL — ABNORMAL HIGH (ref 0.00–0.07)
Basophils Absolute: 0.1 10*3/uL (ref 0.0–0.1)
Basophils Relative: 1 %
Eosinophils Absolute: 0.3 10*3/uL (ref 0.0–0.5)
Eosinophils Relative: 3 %
HCT: 39.8 % (ref 36.0–46.0)
Hemoglobin: 12.9 g/dL (ref 12.0–15.0)
Immature Granulocytes: 5 %
Lymphocytes Relative: 13 %
Lymphs Abs: 1.7 10*3/uL (ref 0.7–4.0)
MCH: 30.9 pg (ref 26.0–34.0)
MCHC: 32.4 g/dL (ref 30.0–36.0)
MCV: 95.2 fL (ref 80.0–100.0)
Monocytes Absolute: 1.1 10*3/uL — ABNORMAL HIGH (ref 0.1–1.0)
Monocytes Relative: 9 %
Neutro Abs: 8.8 10*3/uL — ABNORMAL HIGH (ref 1.7–7.7)
Neutrophils Relative %: 69 %
Platelets: 302 10*3/uL (ref 150–400)
RBC: 4.18 MIL/uL (ref 3.87–5.11)
RDW: 13.6 % (ref 11.5–15.5)
WBC: 12.7 10*3/uL — ABNORMAL HIGH (ref 4.0–10.5)
nRBC: 0 % (ref 0.0–0.2)

## 2020-12-22 LAB — BASIC METABOLIC PANEL
Anion gap: 8 (ref 5–15)
BUN: 18 mg/dL (ref 8–23)
CO2: 26 mmol/L (ref 22–32)
Calcium: 8.4 mg/dL — ABNORMAL LOW (ref 8.9–10.3)
Chloride: 104 mmol/L (ref 98–111)
Creatinine, Ser: 0.67 mg/dL (ref 0.44–1.00)
GFR, Estimated: >60 mL/min (ref >60)
Glucose, Bld: 97 mg/dL (ref 70–99)
Potassium: 4 mmol/L (ref 3.5–5.1)
Sodium: 138 mmol/L (ref 135–145)

## 2020-12-22 LAB — CULTURE, BLOOD (SINGLE)
Culture: NO GROWTH
Special Requests: ADEQUATE

## 2020-12-22 LAB — GLUCOSE, CAPILLARY
Glucose-Capillary: 92 mg/dL (ref 70–99)
Glucose-Capillary: 99 mg/dL (ref 70–99)
Glucose-Capillary: 98 mg/dL (ref 70–99)
Glucose-Capillary: 105 mg/dL — ABNORMAL HIGH (ref 70–99)

## 2020-12-22 LAB — PHOSPHORUS: Phosphorus: 2.9 mg/dL (ref 2.5–4.6)

## 2020-12-22 LAB — MAGNESIUM: Magnesium: 2.1 mg/dL (ref 1.7–2.4)

## 2020-12-22 NOTE — Progress Notes (Signed)
PROGRESS NOTE    Jessica Benton  DGU:[id number] DOB: 1929-04-01 DOA: 12/17/2020 PCP: Bartholome Bill, MD   Brief Narrative:  The patient is a 87 year old elderly Caucasian female with a past medical history significant for but not limited to vascular dementia as well as other comorbidities who was recently placed in [place] the last month or so.  They are currently awaiting bed to get memory care section and per her daughter did not seem to be her usual self so she brought to the emergency room for further evaluation.  In the ED she is found to be tachycardic and tachypneic with a fever of 101.4.  She was given a working diagnosis of sepsis secondary to UTI as well as possible concomitant pneumonia and has been initiated on antibiotics.  She is also noted to be in acute hypoxic respiratory failure secondary to her likely pneumonia.  We will be obtaining SLP evaluation to check for aspiration and this is still pending. Repeat CXR this AM showed ". Progressive right base infiltrate consistent with pneumonia. Right pleural effusion again noted. Cardiomegaly again.  No pulmonary venous congestion."  She is still awaiting an SLP evaluation and Palliative Care Conversation.  Palliative care met with the patient and her CODE STATUS was changed to DNR and the plan is for patient to go back to the ALF with hospice his family does not want SNF.  Assessment & Plan:   Principal Problem:   Pyelonephritis Active Problems:   Mixed Alzheimer's and vascular dementia ([place])   Protein-calorie malnutrition, severe  Severe Sepsis 2/2 UTI/Pyelonephritis with concomitant Right sided pneumonia present on admission, improving  -Presented with a fever of 101.4, tachycardia of 111, elevated respiratory rate of 44, and a leukocytosis of 16.6 which is now improved to 12.7 and some fatigue, lethargy, and confusion -Urinalysis done and showed a cloudy appearance with large leukocytes, positive nitrites, urine  protein of 30, present amorphous crystals, many bacteria in the urine, 0-5 RBCs per high-power field, and 6-10 WBCs -Urine culture showing >100000 of Proteus Mirabilis and 40,000 of E Coli with Sensitivities that are mostly pansensitive but the only one that is resistant is a Proteus mirabilis to the nitrofurantoin; -Blood culture x2 still pending but showing NGTD at 1 Day Still have not been updated yet -Initial chest x-ray showed "New small to moderate bilateral pleural effusions and basilar airspace disease which could be due to atelectasis or pneumonia. Aortic Atherosclerosis." -Repeat chest the day before yesterday showed ". Progressive right base infiltrate consistent with pneumonia. Right pleural effusion again noted. Cardiomegaly again.  No pulmonary venous congestion." -Patient was wearing supplemental oxygen via nasal cannula yesterday and does not wear oxygen at home; She was placed on 4 Liters but was saturating 100%; Unclear if she was actually hypoxic given no documented SpO2 <88; Now off of Supplemental O2 via [place]  -Continue with antibiotic coverage with IV ceftriaxone and azithromycin and will continue  -Lactic acid levels 1.7 now trended down to 1.2  -Continue to monitor temperature curve as well as WBC -Check SLP evaluation to evaluate for aspiration and they are recommending Regular Diet with Thin Liquids -Repeat CXR yesterday AM showed "Low volumes with worsening bibasilar atelectasis and probable effusions" and today's chest x-ray showed no change in exam -PT OT recommending SNF but after goals of care discussion family has elected for the patient to go back to her ALF with hospice services and unfortunately will not be able to discharge this weekend  ?  Acute Respiratory Failure with Hypoxia, improved  -In the setting of Above with Pneumonia  -SpO2: 92 % O2 Flow Rate (L/min): 4 L/min; Now off of Supplemental O2 and improved.  -Given adose of IV Lasix 40 mg x1 the day before  yesterday -Continuous Pulse Oximetry and Maintain O2 Saturations >90% -Continue Supplemental O2 via [place] and Wean to Room Air -Obtaining SLP evaluation and as above  -Continue monitor respiratory status carefully and will need ambulatory home O2 screen prior to discharging back to her ALF  Severe malnutrition in the context of chronic illness -Nutritionist consulted for further evaluation and diet will be liberalized from a heart healthy carb modified to 2 g sodium diet  -Estimated body mass index is 18.55 kg/m as calculated from the following:   Height as of this encounter: _0  (1.6 m).   Weight as of this encounter: 47.5 kg. -Nutritionist is recommending Ensure Enlive twice daily as well as 30 mL of Prosource plus twice daily and 1 tab with multivitamin with minerals daily  Hypokalemia -Mild. K+ was 3.1 and improved to 4.0 -Continue to Monitor and Replete as Necessary -Repeat CMP in the AM   Mixed Alzheimer's and Vascular Dementia, End-Stage Acute Confusion Superimposed on Chronic Dementia  -C/w Donepezil 10 mg p.o. nightly, memantine 10 mg p.o. twice daily, sertraline 25 mg p.o. daily -Palliative care consulted for further goals of care discussion and this is still pending   Hypoalbuminemia -Has Bilateral LE Swelling likely from 3rd Spacing -CXR showed Some small Bilateral Effusions -Given a Dose of IV Lasix as above -Albumin Level is now 2.4 on last check  -Continue to Monitor and may obtain a Pre-Albumin Level  DVT prophylaxis: Enoxaparin 30 mg subcu every 24 Code Status: FULL CODE now changed to DNR after goals of care discussion Family Communication: No family present at bedside  Disposition Plan: SNF initially but now will go back to her ALF with hospice services  Status is: Inpatient  Remains inpatient appropriate because:Unsafe d/c plan, IV treatments appropriate due to intensity of illness or inability to take PO, and Inpatient level of care appropriate due to  severity of illness  Dispo: The patient is from: Home              Anticipated d/c is to: SNF              Patient currently is not medically stable to d/c.   Difficult to place patient No  Consultants:  Palliative Care Medicine   Procedures: None  Antimicrobials:  Anti-infectives (From admission, onward)    Start     Dose/Rate Route Frequency Ordered Stop   12/18/20 1600  cefTRIAXone (ROCEPHIN) 2 g in sodium chloride 0.9 % 100 mL IVPB        2 g 200 mL/hr over 30 Minutes Intravenous Every 24 hours 12/17/20 2306 12/23/20 1559   12/18/20 1600  azithromycin (ZITHROMAX) 500 mg in sodium chloride 0.9 % 250 mL IVPB  Status:  Discontinued        500 mg 250 mL/hr over 60 Minutes Intravenous Every 24 hours 12/17/20 2306 12/18/20 1244   12/18/20 1330  azithromycin (ZITHROMAX) tablet 500 mg        500 mg Oral Daily 12/18/20 1244 12/21/20 0818   12/17/20 1545  azithromycin (ZITHROMAX) 500 mg in sodium chloride 0.9 % 250 mL IVPB        500 mg 250 mL/hr over 60 Minutes Intravenous  Once 12/17/20 1534 12/17/20 1759   12/17/20  1515  cefTRIAXone (ROCEPHIN) 1 g in sodium chloride 0.9 % 100 mL IVPB        1 g 200 mL/hr over 30 Minutes Intravenous  Once 12/17/20 1505 12/17/20 1643        Subjective: Seen and Examined at bedside and was awake but pleasantly demented and confused. No CP or SOB. Hard of hearing. Denied any other concerns or complaints and is more awake. No other concerns or complaints at this time.   Objective: Vitals:   12/20/20 2059 12/21/20 0442 12/21/20 1507 12/21/20 2114  BP: (!) 154/87 137/72 (!) 142/76 (!) 145/90  Pulse: 87 84 86 95  Resp: _0 Temp: 98.8 F (37.1 C) 98.1 F (36.7 C) 98 F (36.7 C) (!) 97.5 F (36.4 C)  TempSrc: Oral Oral Oral Axillary  SpO2: 94% 92% 92% 92%  Weight:      Height:        Intake/Output Summary (Last 24 hours) at 12/22/2020 0857 Last data filed at 12/22/2020 0557 Gross per 24 hour  Intake 671.18 ml  Output 450 ml  Net  221.18 ml    Filed Weights   12/17/20 2324  Weight: 47.5 kg   Examination: Physical Exam:  Constitutional: The patient is a thin chronically ill-appearing elderly Caucasian female in NAD but more wake and pleasantly demented  Eyes: PERRL, lids and conjunctivae normal, sclerae anicteric  ENMT: External Ears, Nose appear normal. Grossly normal hearing.  Neck: Appears normal, supple, no cervical masses, normal ROM, no appreciable thyromegaly; no JVD Respiratory: Diminished to auscultation bilaterally, no wheezing, rales, rhonchi or crackles. Normal respiratory effort and patient is not tachypenic. No accessory muscle use. Unlabored breathing  Cardiovascular: RRR, no murmurs / rubs / gallops. S1 and S2 auscultated. 1+ LE edema  Abdomen: Soft, non-tender, non-distended. Bowel sounds positive.  GU: Deferred. Musculoskeletal: No clubbing / cyanosis of digits/nails. No joint deformity upper and lower extremities.  Skin: No rashes, lesions, ulcers on a limited skin evaluation. No induration; Warm and dry.  Neurologic: CN 2-12 grossly intact with no focal deficits. Romberg sign cerebellar reflexes not assessed.  Psychiatric: Impaired  judgment and insight. Awake Alert and oriented x 1. Normal mood and appropriate affect.   Data Reviewed: I have personally reviewed following labs and imaging studies  CBC: Recent Labs  Lab 12/17/20 1352 12/18/20 0459 12/19/20 0848 12/20/20 1013 12/21/20 0541 12/22/20 0535  WBC 16.6* 14.3* 13.0* 12.2* 13.1* 12.7*  NEUTROABS 13.6*  --  10.0* 9.4* 9.8* 8.8*  HGB 13.5 12.1 12.9 12.7 12.7 12.9  HCT 42.8 38.6 41.5 41.9 40.2 39.8  MCV 96.2 98.5 96.7 99.5 96.2 95.2  PLT 278 240 285 272 301 224    Basic Metabolic Panel: Recent Labs  Lab 12/18/20 0459 12/19/20 0848 12/20/20 1013 12/21/20 0541 12/22/20 0535  NA 141 143 140 138 138  K 4.0 3.8 3.3* 3.1* 4.0  CL 106 100 101 102 104  CO2 _1 GLUCOSE 118* 110* 115* 104* 97  BUN 19 25* 25* 23 18   CREATININE 0.77 0.99 0.81 0.76 0.67  CALCIUM 8.7* 8.8* 8.6* 8.3* 8.4*  MG  --  2.3 2.2 2.1 2.1  PHOS  --  4.4 3.2 2.9 2.9    GFR: Estimated Creatinine Clearance: 35 mL/min (by C-G formula based on SCr of 0.67 mg/dL). Liver Function Tests: Recent Labs  Lab 12/17/20 1352 12/18/20 0459 12/19/20 0848 12/20/20 1013 12/21/20 0541  AST _2 28  ALT _0 ALKPHOS 116 100 116 102 107  BILITOT 1.0 0.7 0.7 0.9 0.9  PROT 6.0* 5.2* 6.0* 5.4* 5.4*  ALBUMIN 2.7* 2.3* 2.6* 2.3* 2.4*    No results for input(s): LIPASE, AMYLASE in the last 168 hours. No results for input(s): AMMONIA in the last 168 hours. Coagulation Profile: Recent Labs  Lab 12/17/20 1352 12/17/20 2328  INR 1.2 1.1    Cardiac Enzymes: No results for input(s): CKTOTAL, CKMB, CKMBINDEX, TROPONINI in the last 168 hours. BNP (last 3 results) No results for input(s): PROBNP in the last 8760 hours. HbA1C: No results for input(s): HGBA1C in the last 72 hours.  CBG: Recent Labs  Lab 12/21/20 0739 12/21/20 1206 12/21/20 1732 12/21/20 2118 12/22/20 0720  GLUCAP 92 124* 102* 105* 92    Lipid Profile: No results for input(s): CHOL, HDL, LDLCALC, TRIG, CHOLHDL, LDLDIRECT in the last 72 hours. Thyroid Function Tests: No results for input(s): TSH, T4TOTAL, FREET4, T3FREE, THYROIDAB in the last 72 hours. Anemia Panel: No results for input(s): VITAMINB12, FOLATE, FERRITIN, TIBC, IRON, RETICCTPCT in the last 72 hours. Sepsis Labs: Recent Labs  Lab 12/17/20 1352 12/17/20 2311  LATICACIDVEN 1.7 1.2    RN Pressure Injury Documentation:     Estimated body mass index is 18.55 kg/m as calculated from the following:   Height as of this encounter: _1  (1.6 m).   Weight as of this encounter: 47.5 kg.  Malnutrition Type: Nutrition Problem: Severe Malnutrition Etiology: chronic illness (dementia) Malnutrition Characteristics: Signs/Symptoms: moderate fat depletion, moderate muscle depletion,  severe fat depletion, severe muscle depletion Nutrition Interventions: Interventions: Liberalize Diet, Ensure Enlive (each supplement provides 350kcal and 20 grams of protein), MVI, Magic cup  Radiology Studies: DG CHEST PORT 1 VIEW  Result Date: 12/21/2020 CLINICAL DATA:  Short of breath. EXAM: PORTABLE CHEST 1 VIEW COMPARISON:  12/20/2020 FINDINGS: Stable cardiomediastinal contours. Aortic atherosclerosis. Small bilateral pleural effusions are unchanged from previous exam. There is diminished aeration within both lung bases which may reflect atelectasis and or airspace disease. This is also unchanged when compared with the previous exam. IMPRESSION: No change from previous exam. Electronically Signed   By: Kerby Moors M.D.   On: 12/21/2020 08:08    Scheduled Meds:  aspirin  81 mg Oral Daily   donepezil  10 mg Oral QHS   enoxaparin (LOVENOX) injection  30 mg Subcutaneous Q24H   feeding supplement  237 mL Oral BID BM   insulin aspart  0-9 Units Subcutaneous TID WC   memantine  10 mg Oral BID   multivitamin with minerals  1 tablet Oral Daily   potassium chloride  40 mEq Oral Once   sertraline  25 mg Oral Daily   sodium chloride flush  3 mL Intravenous Q12H   Continuous Infusions:  sodium chloride 250 mL (12/20/20 1845)   cefTRIAXone (ROCEPHIN)  IV Stopped (12/21/20 1748)    LOS: 5 days   Kerney Elbe, DO [facility] PAGER is on [place]  If 7PM-7AM, please contact night-coverage [email or web address]

## 2020-12-22 NOTE — Progress Notes (Signed)
Manufacturing engineer [facility]) Hospital Liaison: RN note     Pt is being set up with hospice services at [facility] room V-126, after discharge. Chart and patient information have been reviewed by [facility] physician. Hospice eligibility confirmed.  Please send signed and completed DNR form home with patient/family.   Please provide prescriptions for mediations at discharge to ensure comfort until patient can be admitted onto hospice services.   DME delivery was attempted today, but the existing bed was still in place.  Per [employer] Address has been verified with [facility]: [address], room V-126.  Please do not hesitate to call with questions.   Thank you for the opportunity to participate in this patient's care.  Domenic Moras, BSN, RN       [place] (listed on AMION under [phone]  [phone] (24h on call)

## 2020-12-23 ENCOUNTER — Inpatient Hospital Stay (HOSPITAL_COMMUNITY): Payer: Medicare PPO

## 2020-12-23 DIAGNOSIS — N12 Tubulo-interstitial nephritis, not specified as acute or chronic: Secondary | ICD-10-CM | POA: Diagnosis not present

## 2020-12-23 DIAGNOSIS — J189 Pneumonia, unspecified organism: Secondary | ICD-10-CM | POA: Diagnosis not present

## 2020-12-23 DIAGNOSIS — E43 Unspecified severe protein-calorie malnutrition: Secondary | ICD-10-CM | POA: Diagnosis not present

## 2020-12-23 DIAGNOSIS — G309 Alzheimer's disease, unspecified: Secondary | ICD-10-CM | POA: Diagnosis not present

## 2020-12-23 LAB — CBC WITH DIFFERENTIAL/PLATELET
Abs Immature Granulocytes: 0.97 10*3/uL — ABNORMAL HIGH (ref 0.00–0.07)
Basophils Absolute: 0.2 10*3/uL — ABNORMAL HIGH (ref 0.0–0.1)
Basophils Relative: 1 %
Eosinophils Absolute: 0.4 10*3/uL (ref 0.0–0.5)
Eosinophils Relative: 3 %
HCT: 41.3 % (ref 36.0–46.0)
Hemoglobin: 12.9 g/dL (ref 12.0–15.0)
Immature Granulocytes: 8 %
Lymphocytes Relative: 13 %
Lymphs Abs: 1.6 10*3/uL (ref 0.7–4.0)
MCH: 29.8 pg (ref 26.0–34.0)
MCHC: 31.2 g/dL (ref 30.0–36.0)
MCV: 95.4 fL (ref 80.0–100.0)
Monocytes Absolute: 1.1 10*3/uL — ABNORMAL HIGH (ref 0.1–1.0)
Monocytes Relative: 9 %
Neutro Abs: 8.3 10*3/uL — ABNORMAL HIGH (ref 1.7–7.7)
Neutrophils Relative %: 66 %
Platelets: 324 10*3/uL (ref 150–400)
RBC: 4.33 MIL/uL (ref 3.87–5.11)
RDW: 13.6 % (ref 11.5–15.5)
WBC: 12.6 10*3/uL — ABNORMAL HIGH (ref 4.0–10.5)
nRBC: 0 % (ref 0.0–0.2)

## 2020-12-23 LAB — CULTURE, BLOOD (ROUTINE X 2)
Culture: NO GROWTH
Culture: NO GROWTH
Special Requests: ADEQUATE
Special Requests: ADEQUATE

## 2020-12-23 LAB — COMPREHENSIVE METABOLIC PANEL
ALT: 24 U/L (ref 0–44)
AST: 29 U/L (ref 15–41)
Albumin: 2.4 g/dL — ABNORMAL LOW (ref 3.5–5.0)
Alkaline Phosphatase: 100 U/L (ref 38–126)
Anion gap: 9 (ref 5–15)
BUN: 18 mg/dL (ref 8–23)
CO2: 28 mmol/L (ref 22–32)
Calcium: 8.7 mg/dL — ABNORMAL LOW (ref 8.9–10.3)
Chloride: 103 mmol/L (ref 98–111)
Creatinine, Ser: 0.65 mg/dL (ref 0.44–1.00)
GFR, Estimated: >60 mL/min (ref >60)
Glucose, Bld: 93 mg/dL (ref 70–99)
Potassium: 3.9 mmol/L (ref 3.5–5.1)
Sodium: 140 mmol/L (ref 135–145)
Total Bilirubin: 0.6 mg/dL (ref 0.3–1.2)
Total Protein: 5.4 g/dL — ABNORMAL LOW (ref 6.5–8.1)

## 2020-12-23 LAB — GLUCOSE, CAPILLARY
Glucose-Capillary: 83 mg/dL (ref 70–99)
Glucose-Capillary: 81 mg/dL (ref 70–99)
Glucose-Capillary: 91 mg/dL (ref 70–99)
Glucose-Capillary: 82 mg/dL (ref 70–99)

## 2020-12-23 LAB — MAGNESIUM: Magnesium: 2.3 mg/dL (ref 1.7–2.4)

## 2020-12-23 LAB — RESP PANEL BY RT-PCR (FLU A&B, COVID) ARPGX2
Influenza A by PCR: NEGATIVE
Influenza B by PCR: NEGATIVE
SARS Coronavirus 2 by RT PCR: NEGATIVE

## 2020-12-23 LAB — PHOSPHORUS: Phosphorus: 3.3 mg/dL (ref 2.5–4.6)

## 2020-12-23 MED ORDER — ADULT MULTIVITAMIN W/MINERALS CH: 1.0000 | ORAL_TABLET | Freq: Every day | ORAL | 0 refills | Status: DC

## 2020-12-23 MED ORDER — AZITHROMYCIN 500 MG PO TABS: 500.0000 mg | ORAL_TABLET | Freq: Every day | ORAL | 0 refills | Status: AC

## 2020-12-23 MED ORDER — CEFDINIR 300 MG PO CAPS
300.0000 mg | ORAL_CAPSULE | Freq: Two times a day (BID) | ORAL | Status: DC
  Administered 2020-12-23 (×2): 300 mg via ORAL
  Filled 2020-12-23 (×2): qty 1

## 2020-12-23 MED ORDER — ONDANSETRON HCL 4 MG PO TABS: 4.0000 mg | ORAL_TABLET | Freq: Four times a day (QID) | ORAL | 0 refills | Status: DC | PRN

## 2020-12-23 MED ORDER — POLYETHYLENE GLYCOL 3350 17 G PO PACK: 17.0000 g | PACK | Freq: Every day | ORAL | 0 refills | Status: DC | PRN

## 2020-12-23 MED ORDER — CEFDINIR 300 MG PO CAPS: 300.0000 mg | ORAL_CAPSULE | Freq: Two times a day (BID) | ORAL | 0 refills | Status: AC

## 2020-12-23 MED ORDER — FUROSEMIDE 10 MG/ML IJ SOLN
40.0000 mg | Freq: Once | INTRAMUSCULAR | Status: AC
  Administered 2020-12-23: 40 mg via INTRAVENOUS
  Filled 2020-12-23: qty 4

## 2020-12-23 MED ORDER — ENSURE ENLIVE PO LIQD: 237.0000 mL | Freq: Two times a day (BID) | ORAL | 12 refills | Status: DC

## 2020-12-23 NOTE — Plan of Care (Signed)

## 2020-12-23 NOTE — Progress Notes (Signed)
SLP Cancellation Note  Patient Details Name: Jessica Benton MRN: [id number] DOB: [birth year]   Cancelled treatment:       Reason Eval/Treat Not Completed: Patient's level of consciousness;Other (comment) (SLP attempted to see patient for swallow tx but she was asleep and only aroused minimally to voice. Will continue attempts.)   Sonia Baller, MA, CCC-SLP Speech Therapy

## 2020-12-23 NOTE — Progress Notes (Signed)
Pt discharge to skilled nursing facility. AVS in packet. Vitals stable. Pt being transported out by [facility]. Transport has no concerns at this time.

## 2020-12-23 NOTE — Discharge Summary (Addendum)
Physician Discharge Summary  Jessica Benton LKG:[id number] DOB: 1929-03-22 DOA: 12/17/2020  PCP: Bartholome Bill, MD  Admit date: 12/17/2020 Discharge date: 12/23/2020  Admitted From: ALF Disposition: ALF with Hospice   Recommendations for Outpatient Follow-up:  Follow up with PCP in 1-2 weeks Please obtain CMP/CBC, Mag, Phos in one week Please follow up on the following pending results:  Home Health: No  Equipment/Devices: Insurance account manager; Wheelchair    Discharge Condition: Stable CODE STATUS: DO NOT RESUSCITATE  Diet recommendation: 2 gram Sodium Diet; Pt may have Medications crushed and given with apple sauce  Brief/Interim Summary: The patient is a 87 year old elderly Caucasian female with a past medical history significant for but not limited to vascular dementia as well as other comorbidities who was recently placed in [facility] the last month or so.  They are currently awaiting bed to get memory care section and per her daughter did not seem to be her usual self so she brought to the emergency room for further evaluation.  In the ED she is found to be tachycardic and tachypneic with a fever of 101.4.  She was given a working diagnosis of sepsis secondary to UTI as well as possible concomitant pneumonia and has been initiated on antibiotics.  She is also noted to be in acute hypoxic respiratory failure secondary to her likely pneumonia.  We will be obtaining SLP evaluation to check for aspiration and this is still pending. Repeat CXR this AM showed ". Progressive right base infiltrate consistent with pneumonia. Right pleural effusion again noted. Cardiomegaly again.  No pulmonary venous congestion."   She is still awaiting an SLP evaluation and Palliative Care Conversation.  Palliative care met with the patient and her CODE STATUS was changed to DNR and the plan is for patient to go back to the ALF with hospice his family does not want SNF.  Discharge Diagnoses:   Principal Problem:   Pyelonephritis Active Problems:   Mixed Alzheimer's and vascular dementia ([place])   Protein-calorie malnutrition, severe  Severe Sepsis 2/2 UTI/Pyelonephritis with concomitant Right sided pneumonia present on admission, improving  -Presented with a fever of 101.4, tachycardia of 111, elevated respiratory rate of 44, and a leukocytosis of 16.6 which is now improved to 12.6 and some fatigue, lethargy, and confusion -Urinalysis done and showed a cloudy appearance with large leukocytes, positive nitrites, urine protein of 30, present amorphous crystals, many bacteria in the urine, 0-5 RBCs per high-power field, and 6-10 WBCs -Urine culture showing >100000 of Proteus Mirabilis and 40,000 of E Coli with Sensitivities that are mostly pansensitive but the only one that is resistant is a Proteus mirabilis to the nitrofurantoin; -Blood culture x2 still pending but showing NGTD at 4 Days -Initial chest x-ray showed "New small to moderate bilateral pleural effusions and basilar airspace disease which could be due to atelectasis or pneumonia. Aortic Atherosclerosis." -Repeat chest the day before yesterday showed ". Progressive right base infiltrate consistent with pneumonia. Right pleural effusion again noted. Cardiomegaly again.  No pulmonary venous congestion." -Patient was wearing supplemental oxygen via nasal cannula yesterday and does not wear oxygen at home; She was placed on 4 Liters but was saturating 100%; Unclear if she was actually hypoxic given no documented SpO2 <88; Now off of Supplemental O2 via   -Continue with antibiotic coverage with IV ceftriaxone and azithromycin and will continue and change to po for D/C for 2 more days -Lactic acid levels 1.7 now trended down to 1.2 -Continue to monitor temperature  curve as well as WBC -Check SLP evaluation to evaluate for aspiration and they are recommending Regular Diet with Thin Liquids -Repeat CXR this AM showed "Trachea is  midline. Heart size stable. Thoracic aorta is calcified. Interstitial prominence and indistinctness appear similar. Layering bilateral pleural effusions." -PT OT recommending SNF but after goals of care discussion family has elected for the patient to go back to her ALF with hospice services and unfortunately was not be able to discharged this past weekend so will D/C back today    ? Acute Respiratory Failure with Hypoxia, improved  -In the setting of Above with Pneumonia -SpO2: 93 % O2 Flow Rate (L/min): 4 L/min; Now off of Supplemental O2 and improved.  -Given adose of IV Lasix 40 mg x1 earlier in the Admission and will repeat today prior to D/C -Continuous Pulse Oximetry and Maintain O2 Saturations >90% -Continue Supplemental O2 via [place] and Wean to Room Air -Obtaining SLP evaluation and as above  -Continue monitor respiratory status carefully and will need ambulatory home O2 screen prior to discharging back to her ALF   Severe malnutrition in the context of chronic illness -Nutritionist consulted for further evaluation and diet will be liberalized from a heart healthy carb modified to 2 g sodium diet -Estimated body mass index is 18.55 kg/m as calculated from the following:   Height as of this encounter: _0  (1.6 m).   Weight as of this encounter: 47.5 kg. -Nutritionist is recommending Ensure Enlive twice daily as well as 30 mL of Prosource plus twice daily and 1 tab with multivitamin with minerals daily   Hypokalemia -Mild. 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 nightly, memantine 10 mg p.o. twice daily, sertraline 25 mg p.o. daily -Palliative care consulted for further goals of care discussion and plan is to go back to ALF with Hospice    Hypoalbuminemia -Has Bilateral LE  Swelling likely from 3rd Spacing -CXR showed Some small Bilateral Effusions -Given a Dose of IV Lasix as above -Albumin Level is now 2.4 on last check -Continue to Monitor and may obtain a Pre-Albumin Level   Discharge Instructions  Allergies as of 12/23/2020   No Known Allergies      Medication List     TAKE these medications    acetaminophen 325 MG tablet Commonly known as: TYLENOL Take 650 mg by mouth every 6 (six) hours as needed for mild pain or moderate pain.   aspirin 81 MG chewable tablet Chew 81 mg by mouth daily.   azithromycin 500 MG tablet Commonly known as: Zithromax Take 1 tablet (500 mg total) by mouth daily for 2 days. Take 1 tablet daily for 3 days.   cefdinir 300 MG capsule Commonly known as: OMNICEF Take 1 capsule (300 mg total) by mouth every 12 (twelve) hours for 2 days.   diclofenac Sodium 1 % Gel Commonly known as: VOLTAREN Apply 4 g topically every 8 (eight) hours as needed for pain. Apply to both knees   donepezil 10 MG tablet Commonly known as: ARICEPT Take 1 tablet (10 mg total) by mouth at bedtime.   feeding supplement Liqd Take 237 mLs by mouth 2 (two) times daily between meals.   memantine 10 MG tablet Commonly known as: NAMENDA Take 1 tablet by mouth twice daily What changed:  how much to take how to take this when to take this additional instructions   multivitamin with minerals Tabs tablet Take 1 tablet by mouth daily.   ondansetron 4 MG tablet Commonly known as: ZOFRAN Take 1 tablet (4 mg total) by mouth every 6 (six) hours as needed for nausea.   polyethylene glycol 17 g packet Commonly known as: MIRALAX / GLYCOLAX Take 17 g by mouth daily as needed for mild constipation.   sertraline 25 MG tablet Commonly known as: Zoloft Take 1 tablet (25 mg total) by mouth daily.        Follow-up Information     Bartholome Bill, MD. Call.   Specialty: Family Medicine Why: Follow up within 1-2 weeks Contact  information: [place] [id number] [phone]                No Known Allergies  Consultations: Palliative Care Medicine   Procedures/Studies: DG Chest 2 View  Result Date: 11/28/2020 CLINICAL DATA:  87 year old female with history of chest pain. EXAM: CHEST - 2 VIEW COMPARISON:  Chest x-ray 07/27/2007. FINDINGS: Lung volumes are low. No consolidative airspace disease. No definite pleural effusions. There is cephalization of the pulmonary vasculature and slight indistinctness of the interstitial markings suggestive of mild pulmonary edema. No pneumothorax. Mild cardiomegaly. Severe calcifications of the mitral annulus. Upper mediastinal contours are within normal limits allowing for patient positioning. Atherosclerotic calcifications in the thoracic aorta. Surgical clips project over the upper abdomen. IMPRESSION: 1. The appearance the chest suggests mild congestive heart failure, as above. 2. Aortic atherosclerosis. 3. Severe calcifications of the mitral annulus. Electronically Signed   By: Vinnie Langton M.D.   On: 11/28/2020 05:39   DG CHEST PORT 1 VIEW  Result Date: 12/23/2020 CLINICAL DATA:  Shortness of breath. EXAM: PORTABLE CHEST 1 VIEW COMPARISON:  12/21/2020. FINDINGS: Trachea is midline. Heart size stable. Thoracic aorta is calcified. Interstitial prominence and indistinctness appear similar. Layering bilateral pleural effusions. IMPRESSION: Congestive heart failure. Electronically Signed   By: Lorin Picket M.D.   On: 12/23/2020 08:14   DG CHEST PORT 1 VIEW  Result Date: 12/21/2020 CLINICAL DATA:  Short of breath. EXAM: PORTABLE CHEST 1 VIEW COMPARISON:  12/20/2020 FINDINGS: Stable cardiomediastinal contours. Aortic atherosclerosis. Small bilateral pleural effusions are unchanged from previous exam. There is diminished aeration within both lung bases which may reflect atelectasis and or airspace disease. This is also unchanged when  compared with the previous exam. IMPRESSION: No change from previous exam. Electronically Signed   By: Kerby Moors M.D.   On: 12/21/2020 08:08   DG CHEST PORT 1 VIEW  Result Date: 12/20/2020 CLINICAL DATA:  Sob EXAM: PORTABLE CHEST - 1 VIEW COMPARISON:  the previous day's study FINDINGS: Low lung volumes. Worsening consolidation/atelectasis at the right lung base. Question worsening left pleural effusion. Probable small right effusion as well. Heart size and mediastinal contours are within normal limits. Aortic Atherosclerosis (ICD10-170.0). No pneumothorax. Visualized bones unremarkable.  Cholecystectomy clips. IMPRESSION: Low volumes with worsening bibasilar atelectasis and probable effusions Electronically Signed   By: Lucrezia Europe M.D.   On: 12/20/2020 07:49   DG CHEST PORT 1 VIEW  Result Date: 12/19/2020 CLINICAL DATA:  Shortness of breath. EXAM: PORTABLE CHEST 1 VIEW COMPARISON:  12/18/2020. FINDINGS: Stable cardiomegaly. No pulmonary venous congestion. Mitral annular calcification. Progressive right base infiltrate. Small right pleural effusion. No pneumothorax. Surgical clips right upper quadrant. IMPRESSION: 1. Progressive right base infiltrate consistent with pneumonia. Right  pleural effusion again noted. 2.  Cardiomegaly again.  No pulmonary venous congestion. Electronically Signed   By: Marcello Moores  Register   On: 12/19/2020 10:15   DG CHEST PORT 1 VIEW  Result Date: 12/18/2020 CLINICAL DATA:  Fever, tachypnea and tachycardia today. EXAM: PORTABLE CHEST 1 VIEW COMPARISON:  Single-view of the chest 12/17/2020. FINDINGS: Small bilateral pleural effusions and basilar atelectasis are greater on the right and decreased since the prior examination. Cardiomegaly. No pneumothorax. Aortic atherosclerosis. IMPRESSION: Right greater than left small pleural effusions and basilar atelectasis appear improved compared to the prior exam. Electronically Signed   By: Inge Rise M.D.   On: 12/18/2020 12:23    DG Chest Port 1 View  Result Date: 12/17/2020 CLINICAL DATA:  Fever and fatigue. EXAM: PORTABLE CHEST 1 VIEW COMPARISON:  PA and lateral chest 11/28/2020 and 07/21/2006. FINDINGS: The patient has new small to moderate bilateral pleural effusions and basilar airspace disease. There is cardiomegaly. Aortic atherosclerosis. No pneumothorax. No acute or focal bony abnormality. IMPRESSION: New small to moderate bilateral pleural effusions and basilar airspace disease which could be due to atelectasis or pneumonia. Aortic Atherosclerosis (ICD10-I70.0). Electronically Signed   By: Inge Rise M.D.   On: 12/17/2020 14:33     Subjective: Seen and examined at bedside and was resting in NAD. No overnight changes. Ready to D/C back to ALF with Hospices.    Discharge Exam: Vitals:   12/22/20 1956 12/23/20 0515  BP: 130/75 (!) 156/84  Pulse: 84 81  Resp: 15 17  Temp: 98.4 F (36.9 C) 97.6 F (36.4 C)  SpO2: 92% 93%   Vitals:   12/21/20 2114 12/22/20 1640 12/22/20 1956 12/23/20 0515  BP: (!) 145/90 140/81 130/75 (!) 156/84  Pulse: 95 93 84 81  Resp: _0 Temp: (!) 97.5 F (36.4 C) 97.9 F (36.6 C) 98.4 F (36.9 C) 97.6 F (36.4 C)  TempSrc: Axillary Oral Oral Axillary  SpO2: 92% 91% 92% 93%  Weight:      Height:       General: Pt is a thin cachectic chronically ill appearing Caucasian female not in acute distress Cardiovascular: RRR, S1/S2 +, no rubs, no gallops Respiratory: Diminished bilaterally, no wheezing, no rhonchi Abdominal: Soft, NT, ND, bowel sounds + Extremities: 1+ LE edema, no cyanosis  The results of significant diagnostics from this hospitalization (including imaging, microbiology, ancillary and laboratory) are listed below for reference.    Microbiology: Recent Results (from the past 240 hour(s))  Blood culture (routine single)     Status: None   Collection Time: 12/17/20  1:53 PM   Specimen: BLOOD LEFT FOREARM  Result Value Ref Range Status    Specimen Description   Final    BLOOD LEFT FOREARM Performed at [facility] [address], [place], [place] [address]    Special Requests   Final    BOTTLES DRAWN AEROBIC AND ANAEROBIC Blood Culture adequate volume Performed at [facility] [address], [place], [place] [address]    Culture   Final    NO GROWTH 5 DAYS Performed at [facility], [place] [address] [place], [place] [address]    Report Status 12/22/2020 FINAL  Final  Resp Panel by RT-PCR (Flu A&B, Covid) Nasopharyngeal Swab     Status: None   Collection Time: 12/17/20  1:55 PM   Specimen: Nasopharyngeal Swab; Nasopharyngeal(NP) swabs in vial transport medium  Result Value Ref Range Status   SARS Coronavirus 2  by RT PCR NEGATIVE NEGATIVE Final    Comment: (NOTE) SARS-CoV-2 target nucleic acids are NOT DETECTED.  The SARS-CoV-2 RNA is generally detectable in upper respiratory specimens during the acute phase of infection. The lowest concentration of SARS-CoV-2 viral copies this assay can detect is 138 copies/mL. A negative result does not preclude SARS-Cov-2 infection and should not be used as the sole basis for treatment or other patient management decisions. A negative result may occur with  improper specimen collection/handling, submission of specimen other than nasopharyngeal swab, presence of viral mutation(s) within the areas targeted by this assay, and inadequate number of viral copies(<138 copies/mL). A negative result must be combined with clinical observations, patient history, and epidemiological information. The expected result is Negative.  Fact Sheet for Patients:  EntrepreneurPulse.com.au  Fact Sheet for Healthcare Providers:  IncredibleEmployment.be  This test is no t yet approved or cleared by the [place] FDA and  has been authorized for detection and/or diagnosis of SARS-CoV-2 by FDA under an Emergency Use  Authorization (EUA). This EUA will remain  in effect (meaning this test can be used) for the duration of the COVID-19 declaration under Section 564(b)(1) of the Act, 21 U.S.C.section 360bbb-3(b)(1), unless the authorization is terminated  or revoked sooner.       Influenza A by PCR NEGATIVE NEGATIVE Final   Influenza B by PCR NEGATIVE NEGATIVE Final    Comment: (NOTE) The Xpert Xpress SARS-CoV-2/FLU/RSV plus assay is intended as an aid in the diagnosis of influenza from Nasopharyngeal swab specimens and should not be used as a sole basis for treatment. Nasal washings and aspirates are unacceptable for Xpert Xpress SARS-CoV-2/FLU/RSV testing.  Fact Sheet for Patients: EntrepreneurPulse.com.au  Fact Sheet for Healthcare Providers: IncredibleEmployment.be  This test is not yet approved or cleared by the [place] FDA and has been authorized for detection and/or diagnosis of SARS-CoV-2 by FDA under an Emergency Use Authorization (EUA). This EUA will remain in effect (meaning this test can be used) for the duration of the COVID-19 declaration under Section 564(b)(1) of the Act, 21 U.S.C. section 360bbb-3(b)(1), unless the authorization is terminated or revoked.  Performed at [facility], [place] [address], [place], [place] [address]   Urine Culture     Status: Abnormal   Collection Time: 12/17/20  4:03 PM   Specimen: In/Out Cath Urine  Result Value Ref Range Status   Specimen Description   Final    IN/OUT CATH URINE Performed at [facility] [address] [place], [place] [address]    Special Requests   Final    NONE Performed at [facility], [place] [address], [place], [place] [address]    Culture (A)  Final    >=100,000 COLONIES/mL PROTEUS MIRABILIS 40,000 COLONIES/mL ESCHERICHIA COLI    Report Status 12/20/2020 FINAL  Final   Organism ID, Bacteria PROTEUS MIRABILIS (A)   Final   Organism ID, Bacteria ESCHERICHIA COLI (A)  Final      Susceptibility   Escherichia coli - MIC*    AMPICILLIN 4 SENSITIVE Sensitive     CEFAZOLIN <=4 SENSITIVE Sensitive     CEFEPIME <=0.12 SENSITIVE Sensitive     CEFTRIAXONE <=0.25 SENSITIVE Sensitive     CIPROFLOXACIN <=0.25 SENSITIVE Sensitive     GENTAMICIN <=1 SENSITIVE Sensitive     IMIPENEM <=0.25 SENSITIVE Sensitive     NITROFURANTOIN 32 SENSITIVE Sensitive     TRIMETH/SULFA <=20 SENSITIVE Sensitive     AMPICILLIN/SULBACTAM <=2 SENSITIVE Sensitive  PIP/TAZO <=4 SENSITIVE Sensitive     * 40,000 COLONIES/mL ESCHERICHIA COLI   Proteus mirabilis - MIC*    AMPICILLIN <=2 SENSITIVE Sensitive     CEFAZOLIN <=4 SENSITIVE Sensitive     CEFEPIME <=0.12 SENSITIVE Sensitive     CEFTRIAXONE <=0.25 SENSITIVE Sensitive     CIPROFLOXACIN <=0.25 SENSITIVE Sensitive     GENTAMICIN <=1 SENSITIVE Sensitive     IMIPENEM 2 SENSITIVE Sensitive     NITROFURANTOIN 128 RESISTANT Resistant     TRIMETH/SULFA <=20 SENSITIVE Sensitive     AMPICILLIN/SULBACTAM <=2 SENSITIVE Sensitive     PIP/TAZO <=4 SENSITIVE Sensitive     * >=100,000 COLONIES/mL PROTEUS MIRABILIS  Culture, blood (x 2)     Status: None (Preliminary result)   Collection Time: 12/17/20 11:28 PM   Specimen: BLOOD  Result Value Ref Range Status   Specimen Description   Final    BLOOD RIGHT ARM Performed at [facility] [address], [place], [place] [address]    Special Requests   Final    BOTTLES DRAWN AEROBIC ONLY Blood Culture adequate volume Performed at [facility] [address], [place], [place] [address]    Culture   Final    NO GROWTH 4 DAYS Performed at [facility], [place] [address] [place], [place] [address]    Report Status PENDING  Incomplete  Culture, blood (x 2)     Status: None (Preliminary result)   Collection Time: 12/17/20 11:28 PM   Specimen: BLOOD  Result Value Ref Range Status   Specimen  Description   Final    BLOOD RIGHT WRIST Performed at [facility] [address], [place], [place] [address]    Special Requests   Final    BOTTLES DRAWN AEROBIC ONLY Blood Culture adequate volume Performed at [facility] [address] [place], [place] [address]    Culture   Final    NO GROWTH 4 DAYS Performed at [facility], [place] [address] [place], [place] [address]    Report Status PENDING  Incomplete  MRSA Next Gen by PCR, Nasal     Status: None   Collection Time: 12/18/20  9:52 AM   Specimen: Nasal Mucosa; Nasal Swab  Result Value Ref Range Status   MRSA by PCR Next Gen NOT DETECTED NOT DETECTED Final    Comment: (NOTE) The GeneXpert MRSA Assay (FDA approved for NASAL specimens only), is one component of a comprehensive MRSA colonization surveillance program. It is not intended to diagnose MRSA infection nor to guide or monitor treatment for MRSA infections. Test performance is not FDA approved in patients less than 53 years old. Performed at [facility], [place] [address], [place], [place] [address]     Labs: BNP (last 3 results) Recent Labs    11/28/20 1615  BNP 466.5*   Basic Metabolic Panel: Recent Labs  Lab 12/19/20 0848 12/20/20 1013 12/21/20 0541 12/22/20 0535 12/23/20 0358  NA 143 140 138 138 140  K 3.8 3.3* 3.1* 4.0 3.9  CL 100 101 102 104 103  CO2 _0 GLUCOSE 110* 115* 104* 97 93  BUN 25* 25* _1 CREATININE 0.99 0.81 0.76 0.67 0.65  CALCIUM 8.8* 8.6* 8.3* 8.4* 8.7*  MG 2.3 2.2 2.1 2.1 2.3  PHOS 4.4 3.2 2.9 2.9 3.3   Liver Function Tests: Recent Labs  Lab 12/18/20 0459 12/19/20 0848 12/20/20 1013 12/21/20  0541 12/23/20 0358  AST _0 ALT _1 ALKPHOS 100 116 102 107 100  BILITOT 0.7 0.7 0.9 0.9 0.6  PROT 5.2* 6.0* 5.4* 5.4* 5.4*  ALBUMIN 2.3* 2.6* 2.3* 2.4* 2.4*   No results for input(s): LIPASE, AMYLASE in the last 168  hours. No results for input(s): AMMONIA in the last 168 hours. CBC: Recent Labs  Lab 12/19/20 0848 12/20/20 1013 12/21/20 0541 12/22/20 0535 12/23/20 0358  WBC 13.0* 12.2* 13.1* 12.7* 12.6*  NEUTROABS 10.0* 9.4* 9.8* 8.8* 8.3*  HGB 12.9 12.7 12.7 12.9 12.9  HCT 41.5 41.9 40.2 39.8 41.3  MCV 96.7 99.5 96.2 95.2 95.4  PLT 285 272 301 302 324   Cardiac Enzymes: No results for input(s): CKTOTAL, CKMB, CKMBINDEX, TROPONINI in the last 168 hours. BNP: Invalid input(s): POCBNP CBG: Recent Labs  Lab 12/22/20 0720 12/22/20 1146 12/22/20 1653 12/22/20 1958 12/23/20 0718  GLUCAP 92 99 98 105* 83   D-Dimer No results for input(s): DDIMER in the last 72 hours. Hgb A1c No results for input(s): HGBA1C in the last 72 hours. Lipid Profile No results for input(s): CHOL, HDL, LDLCALC, TRIG, CHOLHDL, LDLDIRECT in the last 72 hours. Thyroid function studies No results for input(s): TSH, T4TOTAL, T3FREE, THYROIDAB in the last 72 hours.  Invalid input(s): FREET3 Anemia work up No results for input(s): VITAMINB12, FOLATE, FERRITIN, TIBC, IRON, RETICCTPCT in the last 72 hours. Urinalysis    Component Value Date/Time   COLORURINE YELLOW 12/17/2020 1603   APPEARANCEUR CLOUDY (A) 12/17/2020 1603   LABSPEC 1.011 12/17/2020 1603   PHURINE 9.0 (H) 12/17/2020 1603   GLUCOSEU NEGATIVE 12/17/2020 1603   HGBUR NEGATIVE 12/17/2020 1603   BILIRUBINUR NEGATIVE 12/17/2020 1603   KETONESUR NEGATIVE 12/17/2020 1603   PROTEINUR 30 (A) 12/17/2020 1603   NITRITE POSITIVE (A) 12/17/2020 1603   LEUKOCYTESUR LARGE (A) 12/17/2020 1603   Sepsis Labs Invalid input(s): PROCALCITONIN,  WBC,  LACTICIDVEN Microbiology Recent Results (from the past 240 hour(s))  Blood culture (routine single)     Status: None   Collection Time: 12/17/20  1:53 PM   Specimen: BLOOD LEFT FOREARM  Result Value Ref Range Status   Specimen Description   Final    BLOOD LEFT FOREARM Performed at [facility],  [place] [address] [place], [place] [address]    Special Requests   Final    BOTTLES DRAWN AEROBIC AND ANAEROBIC Blood Culture adequate volume Performed at [facility] [address] [place], [place] [address]    Culture   Final    NO GROWTH 5 DAYS Performed at [facility], [place] [address] [place], [place] [address]    Report Status 12/22/2020 FINAL  Final  Resp Panel by RT-PCR (Flu A&B, Covid) Nasopharyngeal Swab     Status: None   Collection Time: 12/17/20  1:55 PM   Specimen: Nasopharyngeal Swab; Nasopharyngeal(NP) swabs in vial transport medium  Result Value Ref Range Status   SARS Coronavirus 2 by RT PCR NEGATIVE NEGATIVE Final    Comment: (NOTE) SARS-CoV-2 target nucleic acids are NOT DETECTED.  The SARS-CoV-2 RNA is generally detectable in upper respiratory specimens during the acute phase of infection. The lowest concentration of SARS-CoV-2 viral copies this assay can detect is 138 copies/mL. A negative result does not preclude SARS-Cov-2 infection and should not be used as the sole basis for treatment or other patient management decisions. A negative result may occur with  improper  specimen collection/handling, submission of specimen other than nasopharyngeal swab, presence of viral mutation(s) within the areas targeted by this assay, and inadequate number of viral copies(<138 copies/mL). A negative result must be combined with clinical observations, patient history, and epidemiological information. The expected result is Negative.  Fact Sheet for Patients:  EntrepreneurPulse.com.au  Fact Sheet for Healthcare Providers:  IncredibleEmployment.be  This test is no t yet approved or cleared by the [place] FDA and  has been authorized for detection and/or diagnosis of SARS-CoV-2 by FDA under an Emergency Use Authorization (EUA). This EUA will remain  in effect (meaning this test can be used) for the  duration of the COVID-19 declaration under Section 564(b)(1) of the Act, 21 U.S.C.section 360bbb-3(b)(1), unless the authorization is terminated  or revoked sooner.       Influenza A by PCR NEGATIVE NEGATIVE Final   Influenza B by PCR NEGATIVE NEGATIVE Final    Comment: (NOTE) The Xpert Xpress SARS-CoV-2/FLU/RSV plus assay is intended as an aid in the diagnosis of influenza from Nasopharyngeal swab specimens and should not be used as a sole basis for treatment. Nasal washings and aspirates are unacceptable for Xpert Xpress SARS-CoV-2/FLU/RSV testing.  Fact Sheet for Patients: EntrepreneurPulse.com.au  Fact Sheet for Healthcare Providers: IncredibleEmployment.be  This test is not yet approved or cleared by the [place] FDA and has been authorized for detection and/or diagnosis of SARS-CoV-2 by FDA under an Emergency Use Authorization (EUA). This EUA will remain in effect (meaning this test can be used) for the duration of the COVID-19 declaration under Section 564(b)(1) of the Act, 21 U.S.C. section 360bbb-3(b)(1), unless the authorization is terminated or revoked.  Performed at [facility], [place] [address], [place], [place] [address]   Urine Culture     Status: Abnormal   Collection Time: 12/17/20  4:03 PM   Specimen: In/Out Cath Urine  Result Value Ref Range Status   Specimen Description   Final    IN/OUT CATH URINE Performed at [place] [address] [place], [place] [address]    Special Requests   Final    NONE Performed at [facility], [place] [address] [place], [place] [address]    Culture (A)  Final    >=100,000 COLONIES/mL PROTEUS MIRABILIS 40,000 COLONIES/mL ESCHERICHIA COLI    Report Status 12/20/2020 FINAL  Final   Organism ID, Bacteria PROTEUS MIRABILIS (A)  Final   Organism ID, Bacteria ESCHERICHIA COLI (A)  Final      Susceptibility   Escherichia  coli - MIC*    AMPICILLIN 4 SENSITIVE Sensitive     CEFAZOLIN <=4 SENSITIVE Sensitive     CEFEPIME <=0.12 SENSITIVE Sensitive     CEFTRIAXONE <=0.25 SENSITIVE Sensitive     CIPROFLOXACIN <=0.25 SENSITIVE Sensitive     GENTAMICIN <=1 SENSITIVE Sensitive     IMIPENEM <=0.25 SENSITIVE Sensitive     NITROFURANTOIN 32 SENSITIVE Sensitive     TRIMETH/SULFA <=20 SENSITIVE Sensitive     AMPICILLIN/SULBACTAM <=2 SENSITIVE Sensitive     PIP/TAZO <=4 SENSITIVE Sensitive     * 40,000 COLONIES/mL ESCHERICHIA COLI   Proteus mirabilis - MIC*    AMPICILLIN <=2 SENSITIVE Sensitive     CEFAZOLIN <=4 SENSITIVE Sensitive     CEFEPIME <=0.12 SENSITIVE Sensitive     CEFTRIAXONE <=0.25 SENSITIVE Sensitive     CIPROFLOXACIN <=0.25 SENSITIVE Sensitive     GENTAMICIN <=1 SENSITIVE Sensitive     IMIPENEM 2 SENSITIVE Sensitive  NITROFURANTOIN 128 RESISTANT Resistant     TRIMETH/SULFA <=20 SENSITIVE Sensitive     AMPICILLIN/SULBACTAM <=2 SENSITIVE Sensitive     PIP/TAZO <=4 SENSITIVE Sensitive     * >=100,000 COLONIES/mL PROTEUS MIRABILIS  Culture, blood (x 2)     Status: None (Preliminary result)   Collection Time: 12/17/20 11:28 PM   Specimen: BLOOD  Result Value Ref Range Status   Specimen Description   Final    BLOOD RIGHT ARM Performed at [facility] [address], [place], [place] [address]    Special Requests   Final    BOTTLES DRAWN AEROBIC ONLY Blood Culture adequate volume Performed at [facility] [address], [place], [place] [address]    Culture   Final    NO GROWTH 4 DAYS Performed at [facility], [place] [address] [place], [place] [address]    Report Status PENDING  Incomplete  Culture, blood (x 2)     Status: None (Preliminary result)   Collection Time: 12/17/20 11:28 PM   Specimen: BLOOD  Result Value Ref Range Status   Specimen Description   Final    BLOOD RIGHT WRIST Performed at [place]  [address], [place], [place] [address]    Special Requests   Final    BOTTLES DRAWN AEROBIC ONLY Blood Culture adequate volume Performed at [facility] [address] [place], [place] [address]    Culture   Final    NO GROWTH 4 DAYS Performed at [facility], [place] [address] [place], [place] [address]    Report Status PENDING  Incomplete  MRSA Next Gen by PCR, Nasal     Status: None   Collection Time: 12/18/20  9:52 AM   Specimen: Nasal Mucosa; Nasal Swab  Result Value Ref Range Status   MRSA by PCR Next Gen NOT DETECTED NOT DETECTED Final    Comment: (NOTE) The GeneXpert MRSA Assay (FDA approved for NASAL specimens only), is one component of a comprehensive MRSA colonization surveillance program. It is not intended to diagnose MRSA infection nor to guide or monitor treatment for MRSA infections. Test performance is not FDA approved in patients less than 46 years old. Performed at [facility], [place] [address], [place], [place] [address]    Time coordinating discharge: 35 minutes  SIGNED:  Kerney Elbe, DO [facility] 12/23/2020, 9:24 AM Pager is on [place]  If 7PM-7AM, please contact night-coverage [email or web address]

## 2020-12-23 NOTE — Progress Notes (Signed)
Manufacturing engineer [facility]) Hospital Liaison Note     Spoke with patient daughter Earnest Bailey to listen and support. DME has been delivered to [facility] and daughter is aware that [facility] referral center will reach out to her upon discharge to set up hospice services.  Daughter has [facility] contact information.   Please send signed and completed DNR form home with patient/family.   Please provide prescriptions for mediations at discharge to ensure comfort until patient can be admitted onto hospice services.  Please call with any hospice related questions if need arises,  Thank you,  Gar Ponto, RN [facility] HLT [phone]

## 2020-12-23 NOTE — NC FL2 (Signed)
[place] LEVEL OF CARE SCREENING TOOL     IDENTIFICATION  Patient Name: Jessica Benton Birthdate: Dec 24, 1928 Sex: female Admission Date (Current Location): 12/17/2020  [facility] and [place] Number:  Herbalist and Address:  [facility],  [place], [place]      Provider Number:    Attending Physician Name and Address:  Kerney Elbe, DO  Relative Name and Phone Number:  Maryanna Shape dtr [phone]    Current Level of Care: Hospital Recommended Level of Care: [place] Prior Approval Number:    Date Approved/Denied:   PASRR Number:  ([id number])  Discharge Plan: Other (Comment) (ALF)    Current Diagnoses: Patient Active Problem List   Diagnosis Date Noted   Protein-calorie malnutrition, severe 12/18/2020   Pyelonephritis 12/17/2020   Mixed Alzheimer's and vascular dementia ([place]) 06/13/2018   Gait disorder 06/13/2018    Orientation RESPIRATION BLADDER Height & Weight     Self  O2 Incontinent Weight: 47.5 kg Height:  '5\' 3"'$  (160 cm)  BEHAVIORAL SYMPTOMS/MOOD NEUROLOGICAL BOWEL NUTRITION STATUS      Incontinent Diet (2 gm na)  AMBULATORY STATUS COMMUNICATION OF NEEDS Skin   Total Care Verbally Normal                       Personal Care Assistance Level of Assistance  Bathing, Feeding, Dressing, Total care Bathing Assistance: Limited assistance Feeding assistance: Limited assistance Dressing Assistance: Maximum assistance Total Care Assistance: Maximum assistance   Functional Limitations Info  Sight, Hearing, Speech Sight Info: Adequate Hearing Info: Adequate Speech Info: Adequate    SPECIAL CARE FACTORS FREQUENCY  PT (By licensed PT), OT (By licensed OT)     PT Frequency:  (3x week) OT Frequency:  (3x week)            Contractures Contractures Info: Not present    Additional Factors Info  Code Status, Allergies, Psychotropic Code Status Info:  (Full) Allergies  Info:  (NKA) Psychotropic Info:  (aricept;namenda,& zoloft see MAR)             Discharge Medications: acetaminophen 325 MG tablet Commonly known as: TYLENOL Take 650 mg by mouth every 6 (six) hours as needed for mild pain or moderate pain.    aspirin 81 MG chewable tablet Chew 81 mg by mouth daily.    azithromycin 500 MG tablet Commonly known as: Zithromax Take 1 tablet (500 mg total) by mouth daily for 2 days. Take 1 tablet daily for 3 days.    cefdinir 300 MG capsule Commonly known as: OMNICEF Take 1 capsule (300 mg total) by mouth every 12 (twelve) hours for 2 days.    diclofenac Sodium 1 % Gel Commonly known as: VOLTAREN Apply 4 g topically every 8 (eight) hours as needed for pain. Apply to both knees    donepezil 10 MG tablet Commonly known as: ARICEPT Take 1 tablet (10 mg total) by mouth at bedtime.    feeding supplement Liqd Take 237 mLs by mouth 2 (two) times daily between meals.    memantine 10 MG tablet Commonly known as: NAMENDA Take 1 tablet by mouth twice daily What changed: how much to take how to take this when to take this additional instructions    multivitamin with minerals Tabs tablet Take 1 tablet by mouth daily.    ondansetron 4 MG tablet Commonly known as: ZOFRAN Take 1 tablet (4 mg total) by mouth every  6 (six) hours as needed for nausea.    polyethylene glycol 17 g packet Commonly known as: MIRALAX / GLYCOLAX Take 17 g by mouth daily as needed for mild constipation.    sertraline 25 MG tablet Commonly known as: Zoloft Take 1 tablet (25 mg total) by mouth daily.       Relevant Imaging Results:  Relevant Lab Results:   Additional Information  ([phone])  Joaquin Courts, RN

## 2020-12-23 NOTE — Progress Notes (Signed)
Pt to be discharged this afternoon to [facility] ALF. Report called to this facility. Discharge AVS placed in Pt's packet.

## 2020-12-23 NOTE — Progress Notes (Signed)
SLP Cancellation Note  Patient Details Name: Jessica Benton MRN: [id number] DOB: [birth year]   Cancelled treatment:       Reason Eval/Treat Not Completed: Other (comment) (Patient continues to be minimally alert throughout the day. Discharge plan is now for patient to return to ALF but with hospice services. SLP to s/o at this time.)   Sonia Baller, MA, CCC-SLP Speech Therapy

## 2020-12-23 NOTE — TOC Transition Note (Addendum)
Transition of Care [facility]) - CM/SW Discharge Note   Patient Details  Name: JERIANN HALLEN MRN: [id number] Date of Birth: November 22, 1928  Transition of Care [facility]) CM/SW Contact:  Dessa Phi, RN Phone Number: 12/23/2020, 12:17 PM   Clinical Narrative: d/c back to [facility]F w/[facility]-hospice services-dme hospital bed/transport chair delivered-[facility] managed dme. Awaiting rm# tel# for report.  Noted no current covid within last 48hrs-MD/Nsg notified to order rapid covid. Await results.  2p-covid results neg. D/c summary sent await call back from melanie rep @ [facility] ALF for rm#,tel# for nsg report prior calling PTAR. 2:33p-TC [facility] ALF rep Madaline Savage received d/c summary agree to accept back-going to rm#[phone],nsg call report tel#[phone].PTAR called. No further CM needs.     Final next level of care: Assisted Living Barriers to Discharge: No Barriers Identified   Patient Goals and CMS Choice Patient states their goals for this hospitalization and ongoing recovery are:: return back to [facility] CMS Medicare.gov Compare Post Acute Care list provided to:: Patient Represenative (must comment) [facility] dtr [phone]) Choice offered to / list presented to : Adult Children  Discharge Placement                       Discharge Plan and Services   Discharge Planning Services: CM Consult Post Acute Care Choice: Home Health (ALF)                               Social Determinants of Health (SDOH) Interventions     Readmission Risk Interventions No flowsheet data found.

## 2021-06-25 DEATH — deceased

## 2022-06-04 IMAGING — CT CT L SPINE W/O CM
3 of 4 series · 12 of 33 positions shown, 15 images · non-contrast
Comparison: None.

CLINICAL DATA: Low back pain, trauma. 89-year-old post fall 1 week
ago. Pain in the mid lower back.

EXAM:
CT LUMBAR SPINE WITHOUT CONTRAST
TECHNIQUE: Multidetector CT imaging of the lumbar spine was performed without
intravenous contrast administration. Multiplanar CT image
reconstructions were also generated.

[Series 6: l spine soft · axial · 0.33mm/px · z∈[-190,-20]mm · 6 of 111 slices shown, 8 images]
[im 17/111  soft-tissue]
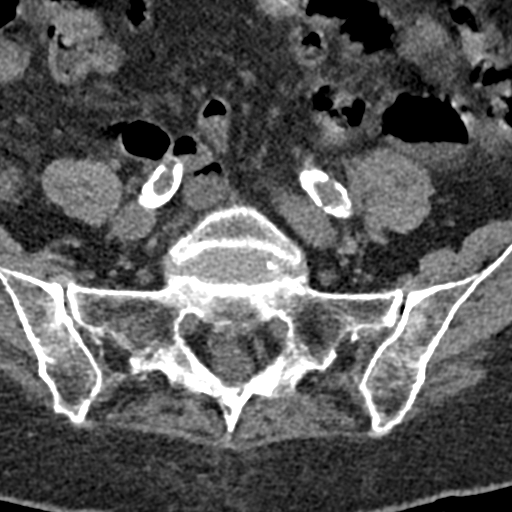
[im 17/111  bone]
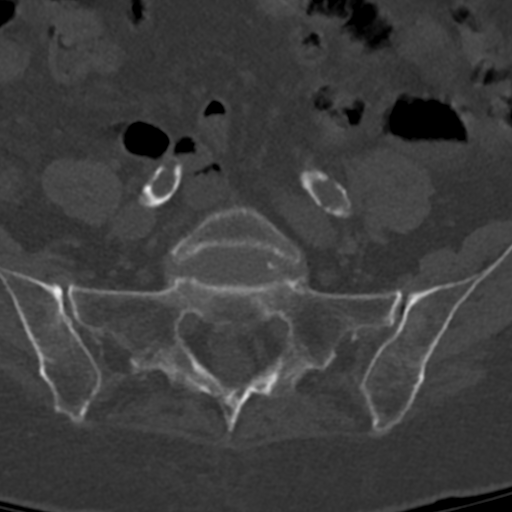
[im 34/111  bone]
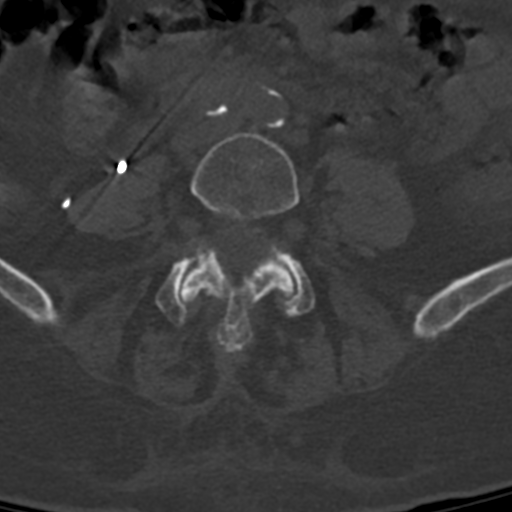
[im 51/111  bone]
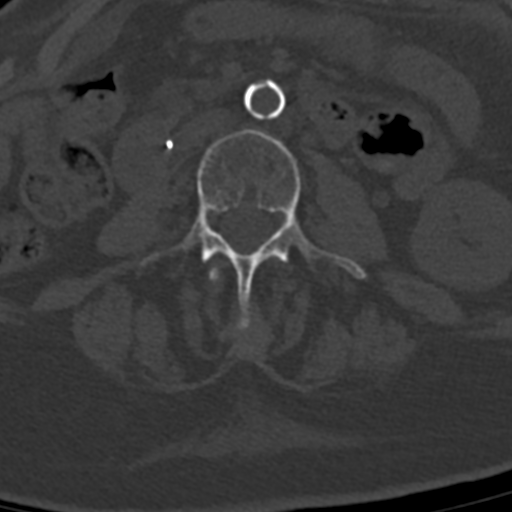
[im 68/111  bone]
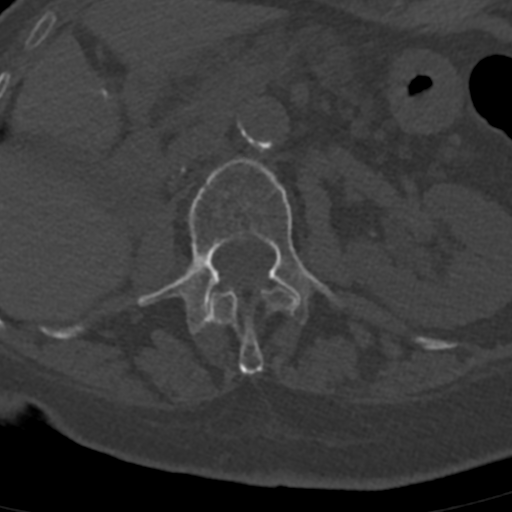
[im 85/111  soft-tissue]
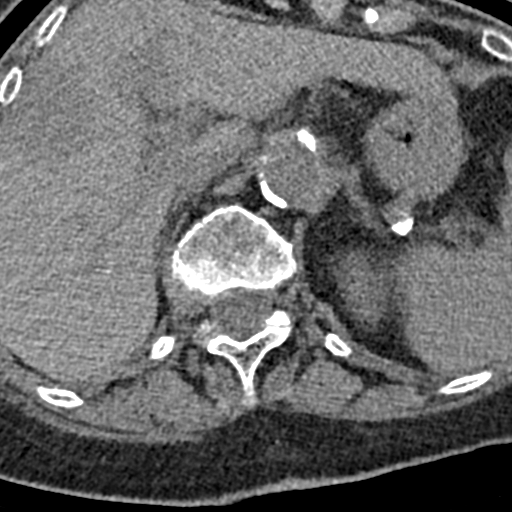
[im 85/111  bone]
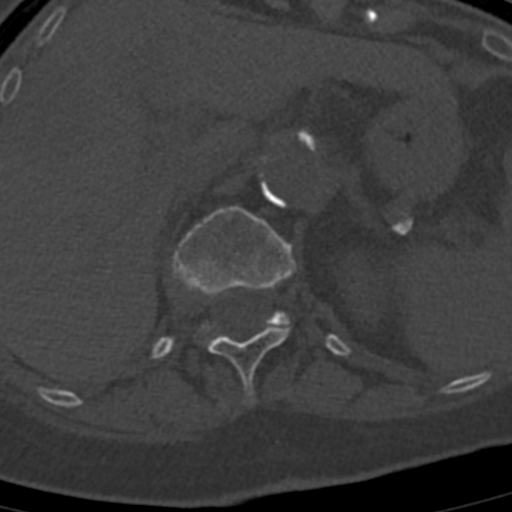
[im 102/111  bone]
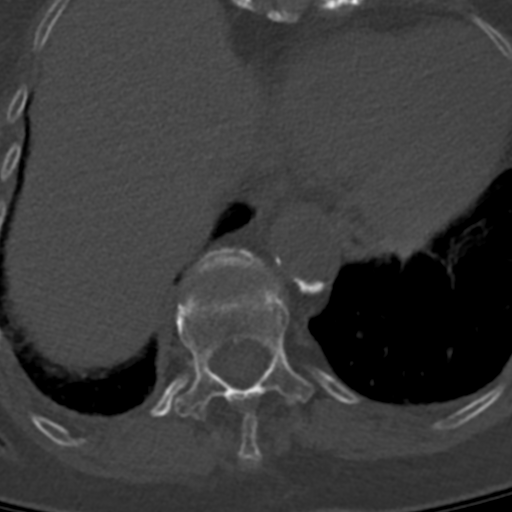

[Series 8: coronal bone · coronal · 0.23mm/px · 1 of 65 slices shown]
[im 33/65  bone]
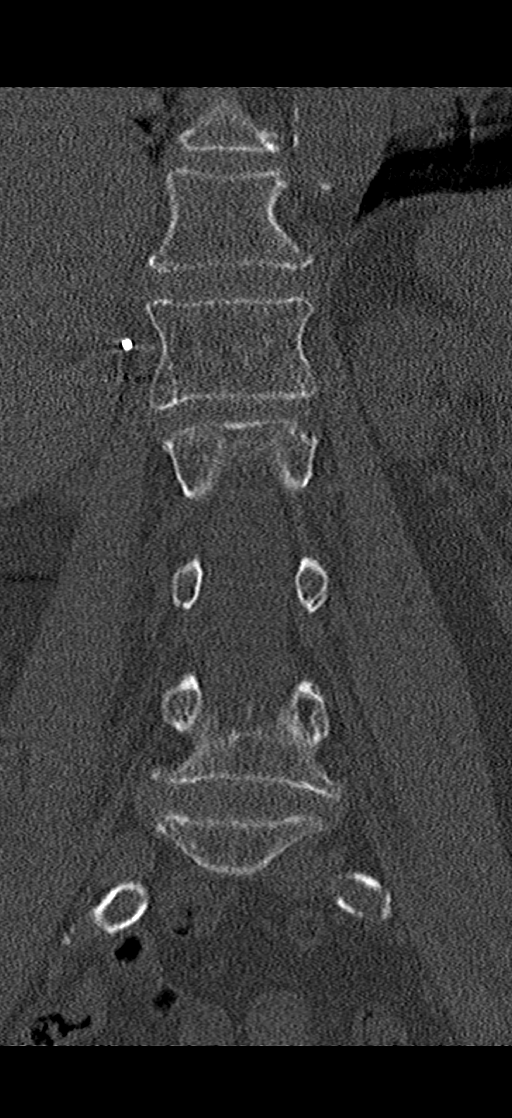

[Series 9: sagittal st · sagittal · 0.32mm/px · 5 of 61 slices shown, 6 images]
[im 21/61  bone]
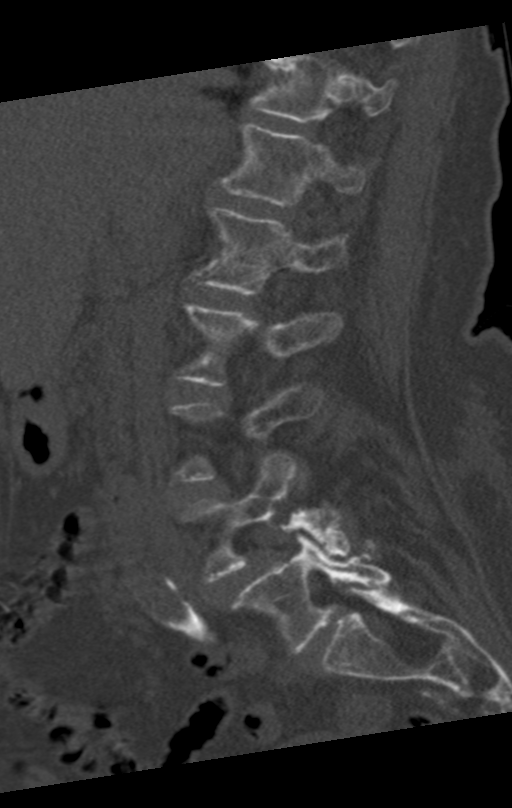
[im 26/61  bone]
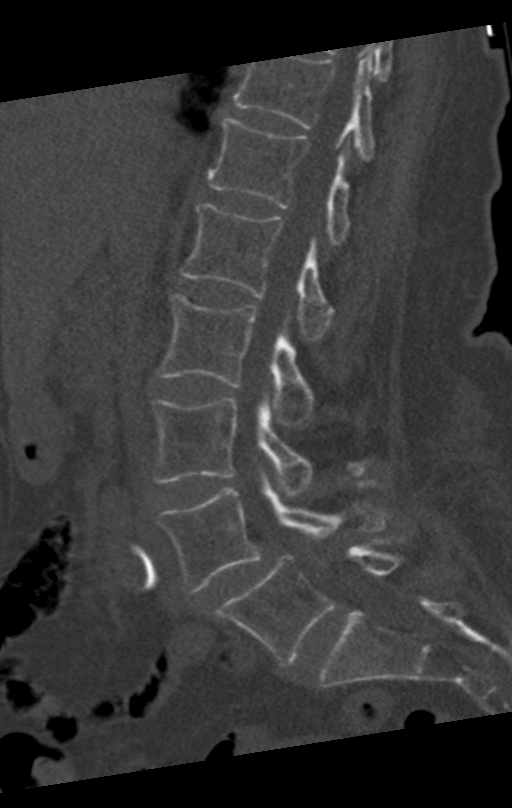
[im 31/61  soft-tissue]
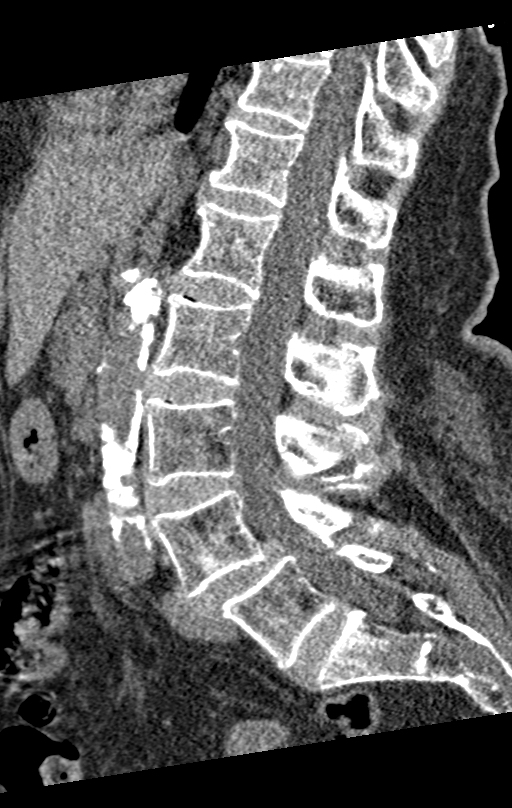
[im 31/61  bone]
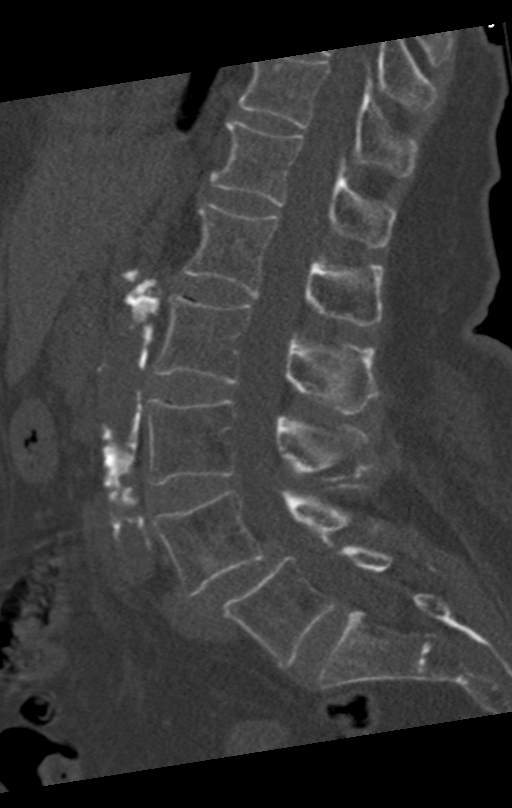
[im 36/61  bone]
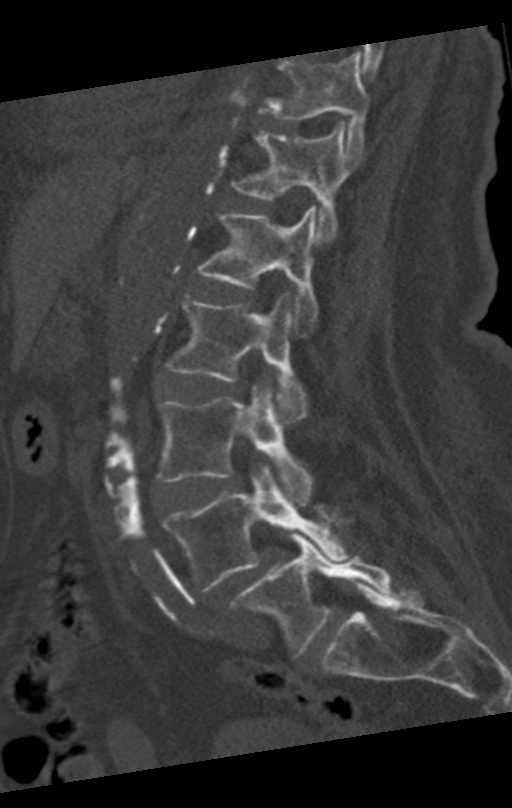
[im 41/61  bone]
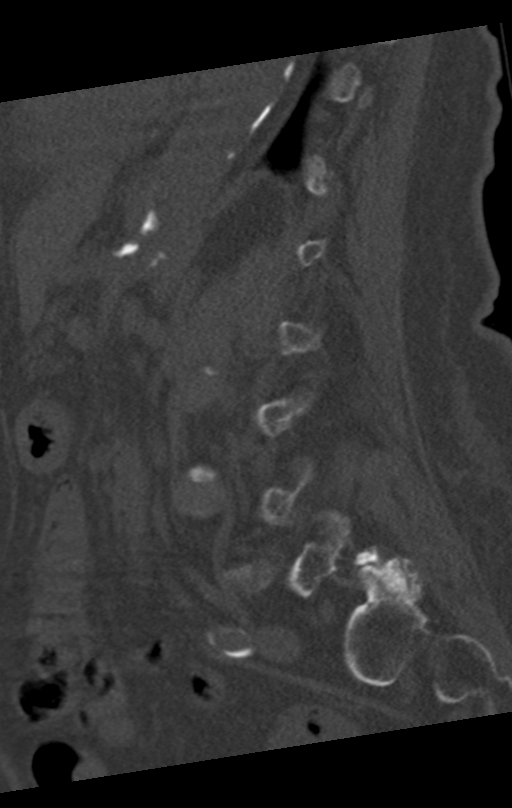

[12 of 33 positions shown; findings below may reference images not displayed]

FINDINGS: Segmentation: 5 lumbar type vertebrae.

Alignment: 7 mm anterolisthesis of L4 on L5 is likely facet
mediated.

Vertebrae: No acute fracture. No vertebral compression fracture. No
evidence of bony destruction or focal lesion. No visualized sacral
fracture.

Paraspinal and other soft tissues: No acute findings. No obvious
intramuscular hematoma. Right nephrectomy with surgical clips in the
right retroperitoneum. Aortic atherosclerosis.

Disc levels: Broad-based disc bulge at L4-L5. There is prominent
bilateral facet hypertrophy at this level. Combination causes
bilateral neural foraminal and spinal canal stenosis. Minimal vacuum
phenomena at L5-S1. L5-S1 facet hypertrophy.
IMPRESSION: 1. No acute fracture of the lumbar spine.
2. Degenerative disc disease and facet arthropathy at L4-L5 with 7
mm anterolisthesis of L4 on L5. Spinal canal and bilateral neural
foraminal stenosis at this level.

Aortic Atherosclerosis ([id number]).

## 2022-09-09 ENCOUNTER — Encounter: Payer: Self-pay | Admitting: *Deleted
